# Patient Record
Sex: Female | Born: 1980 | Hispanic: Yes | Marital: Married | Smoking: Never smoker
Health system: Southern US, Community
[De-identification: ages and names within clinical notes are randomized; demographics above are authoritative.]

## PROBLEM LIST (undated history)

## (undated) DIAGNOSIS — K8050 Calculus of bile duct without cholangitis or cholecystitis without obstruction: Secondary | ICD-10-CM

## (undated) DIAGNOSIS — E039 Hypothyroidism, unspecified: Secondary | ICD-10-CM

## (undated) DIAGNOSIS — K8020 Calculus of gallbladder without cholecystitis without obstruction: Secondary | ICD-10-CM

## (undated) HISTORY — PX: CHOLECYSTECTOMY: SHX55

---

## 2007-06-20 DIAGNOSIS — K219 Gastro-esophageal reflux disease without esophagitis: Secondary | ICD-10-CM | POA: Insufficient documentation

## 2012-07-23 DIAGNOSIS — E039 Hypothyroidism, unspecified: Secondary | ICD-10-CM | POA: Insufficient documentation

## 2019-09-25 ENCOUNTER — Encounter: Payer: Self-pay | Admitting: Emergency Medicine

## 2019-09-25 ENCOUNTER — Other Ambulatory Visit: Payer: Self-pay

## 2019-09-25 ENCOUNTER — Emergency Department: Payer: HRSA Program

## 2019-09-25 ENCOUNTER — Emergency Department
Admission: EM | Admit: 2019-09-25 | Discharge: 2019-09-25 | Disposition: A | Discharge status: Home / Self Care | Payer: HRSA Program | Attending: Emergency Medicine | Admitting: Emergency Medicine

## 2019-09-25 DIAGNOSIS — R05 Cough: Secondary | ICD-10-CM | POA: Diagnosis present

## 2019-09-25 DIAGNOSIS — E039 Hypothyroidism, unspecified: Secondary | ICD-10-CM | POA: Diagnosis not present

## 2019-09-25 DIAGNOSIS — R079 Chest pain, unspecified: Secondary | ICD-10-CM | POA: Insufficient documentation

## 2019-09-25 DIAGNOSIS — R0600 Dyspnea, unspecified: Secondary | ICD-10-CM | POA: Diagnosis not present

## 2019-09-25 DIAGNOSIS — U071 COVID-19: Secondary | ICD-10-CM | POA: Diagnosis not present

## 2019-09-25 HISTORY — DX: Hypothyroidism, unspecified: E03.9

## 2019-09-25 LAB — CBC
HCT: 37.4 % (ref 36.0–46.0)
Hemoglobin: 12.4 g/dL (ref 12.0–15.0)
MCH: 28.3 pg (ref 26.0–34.0)
MCHC: 33.2 g/dL (ref 30.0–36.0)
MCV: 85.4 fL (ref 80.0–100.0)
Platelets: 378 10*3/uL (ref 150–400)
RBC: 4.38 MIL/uL (ref 3.87–5.11)
RDW: 13.1 % (ref 11.5–15.5)
WBC: 5.3 10*3/uL (ref 4.0–10.5)
nRBC: 0 % (ref 0.0–0.2)

## 2019-09-25 LAB — BASIC METABOLIC PANEL
Anion gap: 11 (ref 5–15)
BUN: 6 mg/dL (ref 6–20)
CO2: 26 mmol/L (ref 22–32)
Calcium: 9.2 mg/dL (ref 8.9–10.3)
Chloride: 101 mmol/L (ref 98–111)
Creatinine, Ser: 0.56 mg/dL (ref 0.44–1.00)
GFR calc Af Amer: >60 mL/min (ref >60)
GFR calc non Af Amer: >60 mL/min (ref >60)
Glucose, Bld: 107 mg/dL — ABNORMAL HIGH (ref 70–99)
Potassium: 3.7 mmol/L (ref 3.5–5.1)
Sodium: 138 mmol/L (ref 135–145)

## 2019-09-25 LAB — TROPONIN I (HIGH SENSITIVITY): Troponin I (High Sensitivity): <2 ng/L (ref <18)

## 2019-09-25 MED ORDER — KETOROLAC TROMETHAMINE 60 MG/2ML IM SOLN
60.0000 mg | Freq: Once | INTRAMUSCULAR | Status: AC
  Administered 2019-09-25: 60 mg via INTRAMUSCULAR
  Filled 2019-09-25: qty 2

## 2019-09-25 MED ORDER — ONDANSETRON HCL 4 MG PO TABS: 4.0000 mg | ORAL_TABLET | Freq: Every day | ORAL | 0 refills | Status: DC | PRN

## 2019-09-25 MED ORDER — ACETAMINOPHEN 325 MG PO TABS
650.0000 mg | ORAL_TABLET | Freq: Once | ORAL | Status: AC | PRN
  Administered 2019-09-25: 13:00:00 650 mg via ORAL
  Filled 2019-09-25: qty 2

## 2019-09-25 MED ORDER — HYDROCOD POLST-CPM POLST ER 10-8 MG/5ML PO SUER
5.0000 mL | Freq: Once | ORAL | Status: AC
  Administered 2019-09-25: 5 mL via ORAL
  Filled 2019-09-25: qty 5

## 2019-09-25 MED ORDER — GUAIFENESIN-CODEINE 100-10 MG/5ML PO SOLN: 5.0000 mL | Freq: Four times a day (QID) | ORAL | 0 refills | Status: DC | PRN

## 2019-09-25 MED ORDER — PREDNISONE 10 MG PO TABS
10.0000 mg | ORAL_TABLET | Freq: Every day | ORAL | 0 refills | Status: DC
Start: 2019-09-25 — End: 2024-03-28

## 2019-09-25 MED ORDER — ONDANSETRON 4 MG PO TBDP
4.0000 mg | ORAL_TABLET | Freq: Once | ORAL | Status: AC
  Administered 2019-09-25: 4 mg via ORAL
  Filled 2019-09-25: qty 1

## 2019-09-25 NOTE — ED Provider Notes (Signed)
[facility] Emergency Department Provider Note  Time seen: 1:52 PM  I have reviewed the triage vital signs and the nursing notes.   HISTORY  Chief Complaint Shortness of Breath and Chest Pain   HPI Mackenzie Douglas is a 39 y.o. female with a past medical history of hypothyroidism presents to the emergency department for cough fever shortness of breath.  According to the patient she was diagnosed with Covid approximately 9 days ago.  Continues to have fever, continues to have cough with shortness of breath so she came to the emergency department for evaluation.  Patient states she last took ibuprofen yesterday at 10 PM.  Currently febrile to 102.8 slightly tachycardic and tachypneic.  Reassuringly patient is satting 97% on room air.  Denies any chest pain.  Denies abdominal pain does state nausea but denies vomiting.  Past Medical History:  Diagnosis Date  . Hypothyroid     There are no problems to display for this patient.   History reviewed. No pertinent surgical history.  Prior to Admission medications   Medication Sig Start Date End Date Taking? Authorizing Provider  EUTHYROX 125 MCG tablet Take 125 mcg by mouth daily. 07/03/19   [provider]    No Known Allergies  History reviewed. No pertinent family history.  Social History Social History   Tobacco Use  . Smoking status: Never Smoker  . Smokeless tobacco: Never Used  Substance Use Topics  . Alcohol use: Not on file    Comment: occasionally  . Drug use: Never    Review of Systems Constitutional: Positive for fever. Cardiovascular: Negative for chest pain. Respiratory: Positive for shortness of breath.  Positive for cough. Gastrointestinal: Negative for abdominal pain, vomiting positive for nausea. Genitourinary: Negative for urinary compaints Musculoskeletal: Negative for musculoskeletal complaints Neurological: Negative for headache All other ROS  negative  ____________________________________________   PHYSICAL EXAM:  VITAL SIGNS: ED Triage Vitals  Enc Vitals Group     BP 09/25/19 1209 108/87     Pulse Rate 09/25/19 1209 (!) 121     Resp 09/25/19 1209 (!) 22     Temp 09/25/19 1209 (!) 102.8 F (39.3 C)     Temp Source 09/25/19 1209 Oral     SpO2 09/25/19 1209 97 %     Weight 09/25/19 1210 134 lb (60.8 kg)     Height 09/25/19 1210 5\' 2"  (1.575 m)     Head Circumference --      Peak Flow --      Pain Score 09/25/19 1209 8     Pain Loc --      Pain Edu? --      Excl. in [place]? --    Constitutional: Alert and oriented. Well appearing and in no distress. Eyes: Normal exam ENT      Head: Normocephalic and atraumatic.      Mouth/Throat: Mucous membranes are moist. Cardiovascular: Tachycardic around 120 bpm.  No obvious murmur. Respiratory: Normal respiratory effort without tachypnea nor retractions. Breath sounds are clear.  Frequent cough but no obvious wheeze rales or rhonchi. Gastrointestinal: Soft and nontender. No distention.  Musculoskeletal: Nontender with normal range of motion in all extremities.  Neurologic:  Normal speech and language. No gross focal neurologic deficits  Skin:  Skin is warm, dry and intact.  Psychiatric: Mood and affect are normal.  ____________________________________________    EKG  EKG viewed and interpreted by myself shows sinus tachycardia 122 bpm with a narrow QRS, normal axis, normal intervals,  no concerning ST changes.  ____________________________________________    RADIOLOGY  Right sided pneumonia  ____________________________________________   INITIAL IMPRESSION / ASSESSMENT AND PLAN / ED COURSE  Pertinent labs & imaging results that were available during my care of the patient were reviewed by me and considered in my medical decision making (see chart for details).   Patient presents to the emergency department with continued cough fever nausea and shortness of breath.   Patient diagnosed with Covid several days ago, has been symptomatic for the past 9 days.  Overall the patient appears well satting 97% on room air.  Patient has not taken any antipyretics since yesterday.  We will dose Toradol, Zofran, Tussionex and continue to closely monitor in the emergency department.  As the patient is satting 97% I believe it is reasonable discharge patient home with PCP follow-up and prescriptions for prednisone Zofran and cough medication.  I discussed return precautions.  Patient agreeable to plan of care.  Mackenzie Douglas was evaluated in Emergency Department on 09/25/2019 for the symptoms described in the history of present illness. She was evaluated in the context of the global COVID-19 pandemic, which necessitated consideration that the patient might be at risk for infection with the SARS-CoV-2 virus that causes COVID-19. Institutional protocols and algorithms that pertain to the evaluation of patients at risk for COVID-19 are in a state of rapid change based on information released by regulatory bodies including the CDC and federal and state organizations. These policies and algorithms were followed during the patient's care in the ED.  ____________________________________________   FINAL CLINICAL IMPRESSION(S) / ED DIAGNOSES  Dyspnea COVID-19   Minna Antis, MD 09/25/19 1404

## 2019-09-25 NOTE — ED Triage Notes (Signed)
Pt via pov from dr office with sob and chest pain and headache. Pt states she was diagnosed with covid on Tuesday; symptoms much worse today. Pt states she was told to take tylenol/ibuprofen/vit c/zinc. She is concerned that she will be unable to breathe.  Pt alert & oriented, coughing during triage.

## 2020-04-06 DIAGNOSIS — K8051 Calculus of bile duct without cholangitis or cholecystitis with obstruction: Secondary | ICD-10-CM | POA: Diagnosis present

## 2020-04-06 DIAGNOSIS — K8020 Calculus of gallbladder without cholecystitis without obstruction: Secondary | ICD-10-CM | POA: Insufficient documentation

## 2021-05-11 IMAGING — CR DG CHEST 2V
2 series · 2 of 2 positions shown · non-contrast
Comparison: None.

CLINICAL DATA: Chest pain

EXAM:
CHEST - 2 VIEW

[chest pa]
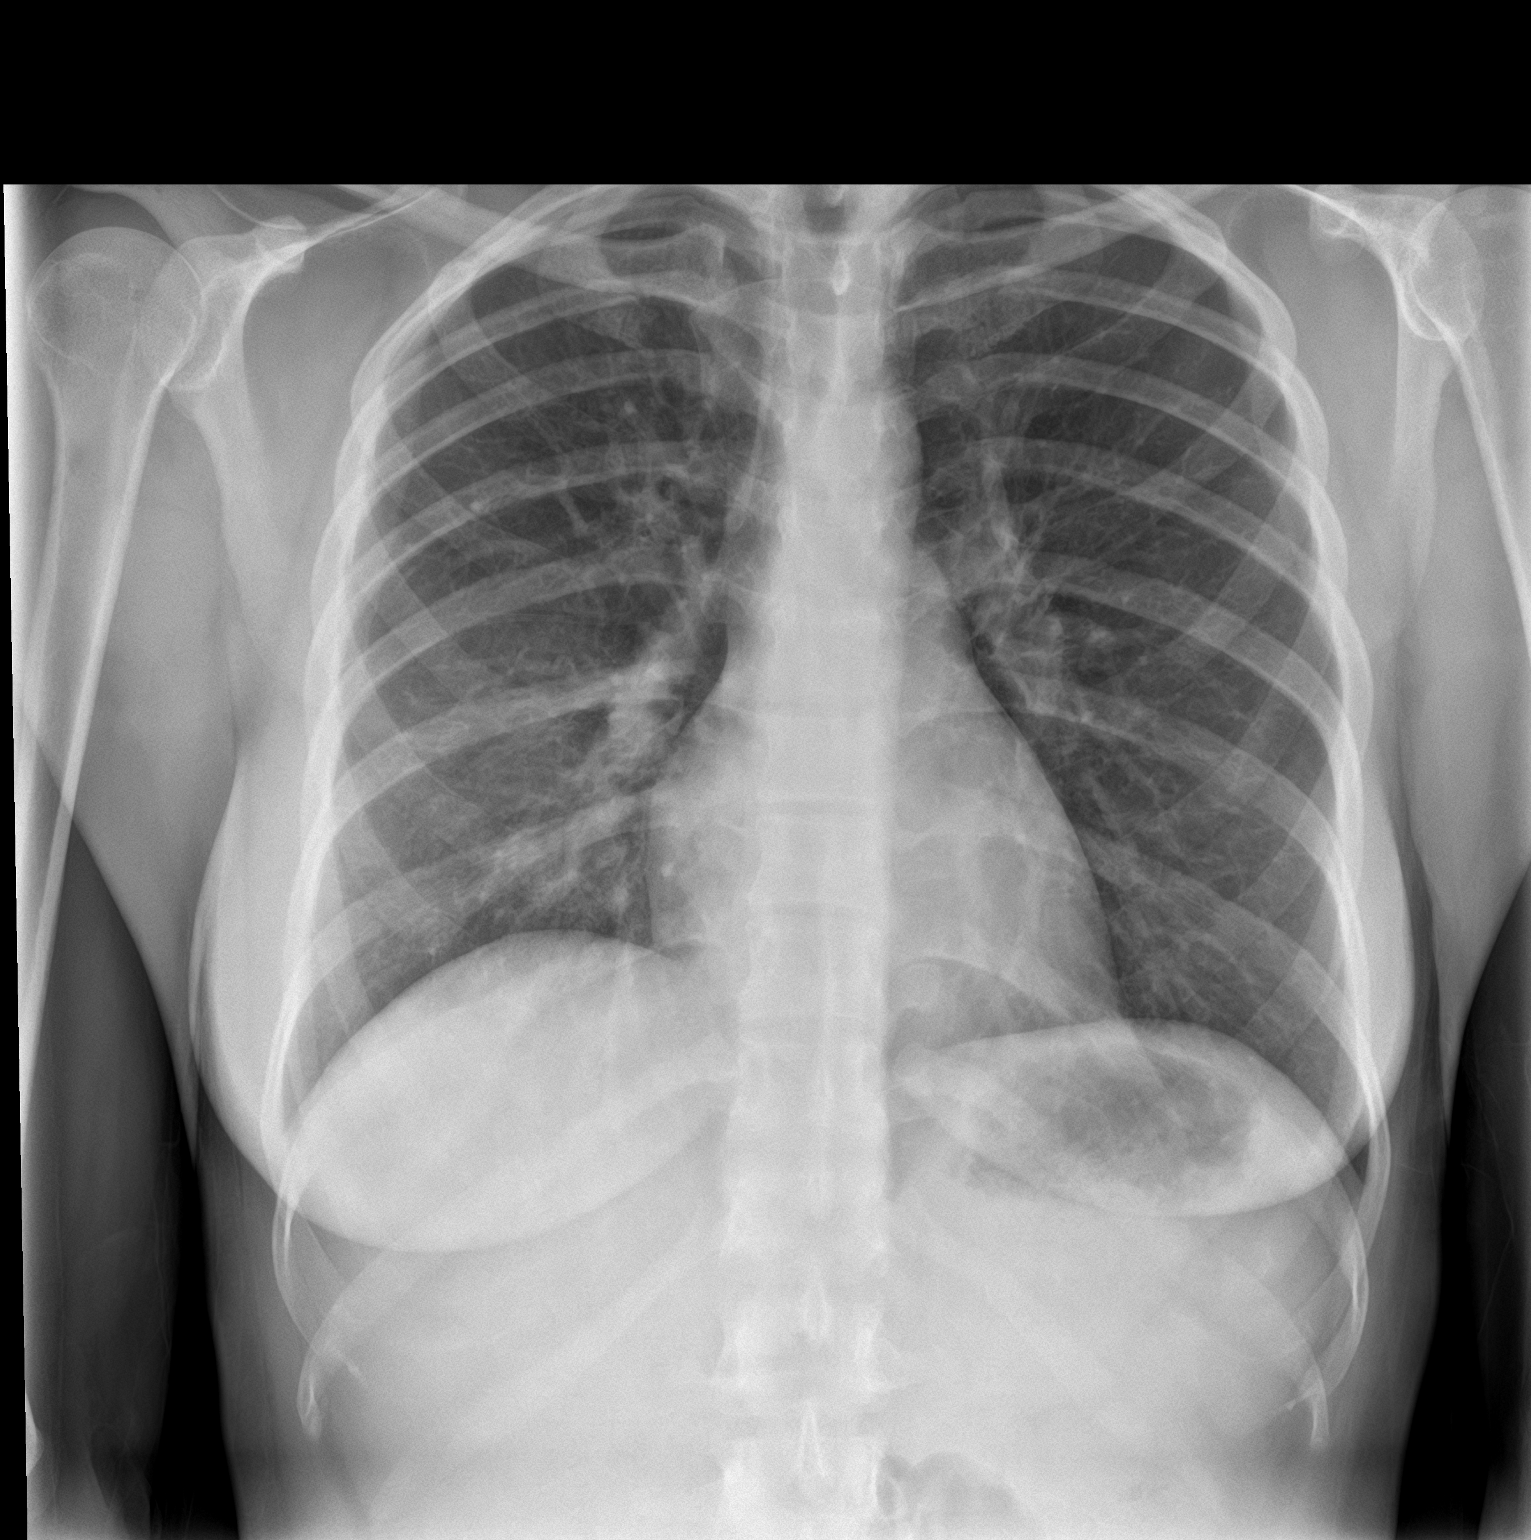

[chest lat]
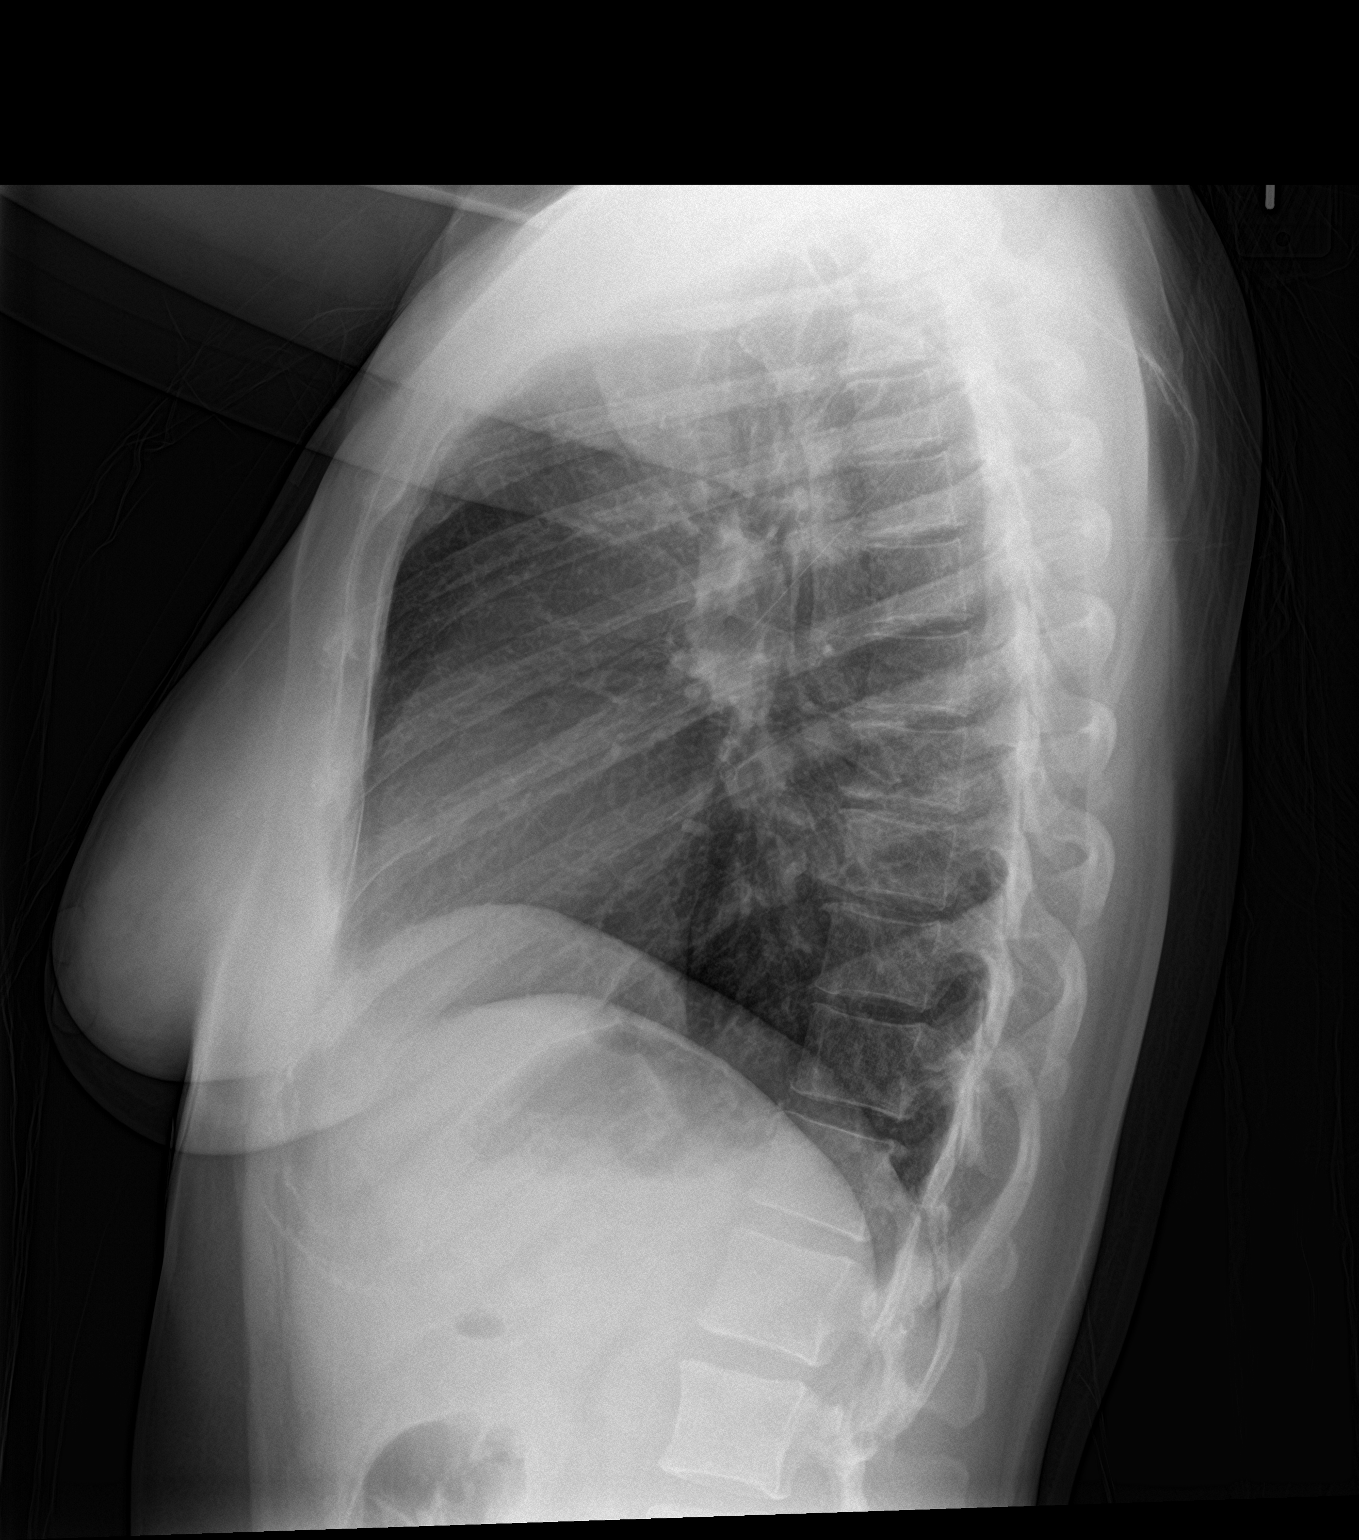

[2 of 2 positions shown; findings below may reference images not displayed]

FINDINGS: There is mild hazy right lower lobe airspace disease. There is no
focal consolidation. There is no pleural effusion or pneumothorax.
The heart and mediastinal contours are unremarkable.

There is no acute osseous abnormality.
IMPRESSION: Mild hazy right lower lobe airspace disease concerning for pneumonia
versus atelectasis.

## 2022-05-31 NOTE — Discharge Summary (Addendum)
 SPANISH TRANSLATION OF AVS THIS IS A DRAFT OF THE SPANISH TRANSLATION OF THE AVS. DO NOT PRINT UNTIL IT HAS BEEN UPDATED BY A SPANISH INTERPRETER.     RESUMEN DE LA VISITA [facility]   Nm. de expediente: [phone]    Clculo biliar  05/30/2022-06/01/2022  5 BT [facility]  [phone]  Los siguientes pasos  Hacer Recoja estos medicamentos en [facility] acetaminophen  docusate sodium  HEALTHYLAX LIDOCAINE PAIN RELIEF oxyCODONE  Leer Lea los documentos adjuntos (los adjuntos en espaol se incluyen en el resumen de la visita en ingls) Colecistectoma:  Informacin postoperatoria (ESPAOL)  Asistir 18 de enero CITA POSTOPERATORIA 2:30 p. m. Llegue a las 2:00 p. m.  [facility]  [address]   [place] [address]  [phone]     ___________________________________________ Instrucciones    Sus medicamentos han cambiado  EMPIECE a usar: acetaminophen  (TYLENOL )  docusate sodium (COLACE)  HEALTHYLAX(polyethylene glycol) LIDOCAINE PAIN RELIEF(lidocaine) oxyCODONE (ROXICODONE)   SIGA usando sus otros medicamentos. Revise la lista actualizada de medicamentos a continuacin.  ___________________________________________  [employer] signos vitales ms recientes Presin arterial 98/53 Temperatura (Oral) 96.1 F Pulso 78 Respiracin 18 Saturacin de oxgeno 96%   Instrucciones sobre la alimentacin Alimentacin del alta (especificar)  Alimentacin del alta: Normal Interno    Otras instrucciones  Llame al mdico si tiene nuseas o vmitos persistentes.   Llame al mdico si tiene enrojecimiento, dolor a la palpacin, o seales de infeccin (dolor, hinchazn, enrojecimiento, mal olor o secrecin verde o amarilla alrededor de la incisin).   Llame al mdico si tiene dolor intenso incontrolable.  Llame al mdico si tiene: Temperatura de ms de 38.5 grados Celsius (ms de 101.3 grados  Fahrenheit).   Instrucciones del alta  Instrucciones   ALIMENTACIN: Puede reanudar su alimentacin normal. Puede que experimente una sensacin de plenitud gstrica o que tenga heces lquidas durante varios das despus de la ciruga, especialmente con alimentos ricos en grasas.   ACTIVIDAD: Puede ducharse a partir de maana (12 de enero), pero evite los baos, baeras de hidromasaje, piscinas, sumergirse o sentarse en el agua durante 2 semanas.  Deje que el jabn y el agua corran sobre la incisin.  No se frote la incisin.       No levante ms de 10 libras o realice actividades vigorosas durante 2 semanas (hasta el 24 de enero de 2024) y luego no ms de 20 libras de 2 a 6 semanas. Si siente tirones en la incisin, disminuya la [place].  Siga estas restricciones hasta el 21 de febrero de 2024.   INCISIN: Revise las incisiones por lo the interpublic group of companies. Una pequea cantidad de secrecin es normal, especialmente durante los primeros das despus de la operacin. Si nota cualquier enrojecimiento alrededor de las incisiones que se extiende por la piel o cualquier secrecin espesa y amarilla, debe llamar a la clnica. El pegamento quirrgico sobre las incisiones se disolver con el tiempo por si solo. No se aplique ungentos o cremas sobre el pegamento. No se quite el pegamento ni tire de l. Evite llevar ropa ajustada.   CUIDADO DEL TUBO DE DRENAJE:  Vace los tubos de drenaje 1 a 2 veces al da, o cuando la pera de goma se haya llenado, conforme se lo haya indicado la enfermera. Registre la cantidad y writer del drenado (color, consistencia, etc.) diariamente, y traiga el registro a su cita de seguimiento. Si el lquido deja de gaffer  del tubo de drenaje de repente, o aumenta demasiado la cantidad producida, o cambia de colores, llame a la clnica.     MEDICAMENTO PARA EL DOLOR: No conduzca autos, ni beba alcohol mientras est tomando el medicamento recetado para chief technology officer. Tome el  medicamento que se le recet para el dolor solamente cuando lo necesite. Puede disminuir la cantidad de medicamento para chief technology officer segn lo tolere y tomar Tylenol  o Motrin siguiendo las instrucciones en el envase.  El medicamento para el dolor puede causar estreimiento. Para evitarlo, deber tomar un ablandador fecal de venta libre, tal como docusate 100 mg 1 a 2 veces al da.   A partir del [address] de junio de 2017, el Gobernador firm [employer] [place] creada para abordar la crisis de opioides que enfrenta [place].  Se llama la ley STOP, abreviatura para Strengthen Opioid Misuse Prevention, 'fortalecer la prevencin de uso indebido de opioides'.       Como parte de esta ley, inicialmente estamos limitados a camera operator un suministro para no ms de pulte homes de automatic data opioide para chief technology officer para nuestros pacientes que se han sometido a una operacin.  (Esta ley tambin limita las recetas para pacientes que no se han sometido a physiological scientist operacin a un suministro para wps resources.     Si necesita medicamentos adicionales para el control de dolor, el mejor nmero al que puede llamar es [phone] antes de que se acaben sus medicamentos actuales.  Durante la noche o fin de [place], llame al [phone] y pregunte por el mdico residente de ciruga de guardia, surgery resident on call. Es posible que el proveedor que est de guardia NO podr renovar su receta.     La Ley STOP tambin supervisa la cantidad de sustancias controladas que cada paciente ha recibido de todos y cada uno de los proveedores.  Las recetas para cada paciente las supervisar [place] Controlled Substances Reporting System 'Sistema de notificacin de sustancias controladas de [place]' de acuerdo con la ley.  Gracias por colaborar con nosotros para que podamos brindar un buen control del dolor de la manera ms segura posible mientras se recupera.   Por favor tenga en cuenta que no se puede llamar a la farmacia para renovar el medicamento  para chief technology officer. Si cree que necesita ms medicamento para el dolor, tendrn que verlo en la clnica o en la sala de emergencias para una evaluacin ms a fondo para asegurarse de que no est sufriendo una complicacin. Est pendiente de cuntas pastillas quedan en el frasco y si tiene pocas y cree que necesitar ms para human resources officer en forma adecuada, llame a la clnica lo antes posible para programar una cita, ya que nuestras clnicas estn muy ocupadas y no podemos garantizarle citas en la misma semana.   El nmero de telfono de la oficina de Traumatologa es [phone]. Para emergencias durante la noche o fines de semanas, llame al [phone] y pregunte por el mdico residente de ciruga de guardia, surgery resident on call.   CUNDO DEBE LLAMAR A LA OFICINA DEL CIRUJANO:  1. Secrecin espesa y [employer] incisiones, enrojecimiento en la zona de las incisiones, sangrado, o separacin de las heridas.  2. Temperatura de ms de 101.5 grados. 3. Nuseas o vmitos incontrolables.  4. Dolor que no se controla con el medicamento para chief technology officer. 5. Ictericia (color amarillento en los ojos o la piel).   Puede alternar Tylenol  y Motrin para ayudar a dejar  de tomar medicamentos opioides.   -  Es muy importante para nosotros que chief technology officer del paciente despus de la operacin est controlado.  Durante el mes despus de que tenga la operacin, puede que reciba una llamada de nosotros preguntndole sobre el dolor postoperatorio y sobre cmo control ese dolor.  La encuesta tomar aproximadamente de 5 a 10 minutos y nos ayudar a proporcionarles una mejor atencin a nuestros pacientes.  Gracias de antemano por su participacin y por favor responda cualquier llamada que provenga de la localidad de [place] o [place].   Seguimiento   Informacin y nmeros de telfono importantes:    Production Manager of Surgery, Division of General and Acute Care Surgery (Departamento de [place],  Divisin de ciruga general y insurance claims handler)  [address]  [place]. [facility] [address]  [place], [place]  [address]  Durante el horario normal de oficina: De lunes a viernes, de 8:00 a. m. a 4:30 p. m. llame a:   - Informacin de contacto de las enfermeras:  [phone]: [place]  [phone]: Ike Narda Burnet)  [phone]: Andres (si necesita cambiar la fecha de la operacin)  Fax: [phone]   Nmero de telfono de la clnica: Para programar, cambiar o cancelar una cita:  [phone]   Everitt noche y en fines de semana, llame a la operadora del hospital y pregunte por el mdico residente de reubin everitt countryman, Surgery Resident on Call al: [phone]   Si tiene una solicitud de FMLA (Family & Medical Leave Act, Toi everitt knack familiar y mdica) u otro papeleo que deba completarse relacionado con el trabajo, la escuela, etc., que no se haya completado durante su hospitalizacin, envelo por fax al nmero antes mencionado [phone] en lugar de llevarlo a su cita en la clnica y esto ayudar a disminuir cualquier retraso en completar el papeleo.   Cita el 18 de enero de 2024 a las 2:30 p. m. para hablar del periodo postoperatorio y oceanographer el tubo de drenaje. Si tiene inquietudes, llame para programar una cita.   Tenga en cuenta que, si llega ms de 20 minutos tarde, tendr que reprogramar la cita.   *Para obtener apoyo e informacin sobre el abuso de sustancias, puede llamar a la lnea nacional de ayuda de SAMHSA (Substance Abuse and Mental Health Services Administration / Administracin de servicios para el abuso de sustancias y la salud mental). Es un servicio gratis, confidencial, las 24 horas del da, [phone] de la [place], 365 das del ao para remisiones para tratamiento y servicio de informacin (en ingls y health and safety inspector) para personas y sus familias que afrontan trastornos por el uso de sustancias o trastornos de salud mental. 1-800-662-HELP [phone]).   Interaccin  farmacolgica Recibi un medicamento durante su operacin (Sugammadex) que pudiese disminuir la eficacia de las pastillas anticonceptivas u otro anticonceptivo hormonal hasta por 7 das. Sugammadex ([place]) es un medicamento que ayuda a psychiatrist de los anestsicos que los pacientes reciben durante la operacin.    Si est usando algn anticonceptivo hormonal (incluyendo pastillas anticonceptivas, inyecciones, implantes, dispositivos intrauterinos, parches, o anillos intravaginales), utilice otro mtodo anticonceptivo complementario hasta por 7 das para prevenir un embarazo imprevisto. Los mtodos anticonceptivos complementarios incluyen condones/condones femeninos, diafragma, capuchn cervical, esponja o abstinencia. Siga tomando sus pastillas anticonceptivas o usando su anticonceptivo hormonal durante este periodo de tiempo.   Seguimiento   18 de enero CITA POSTOPERATORIA jueves, 18 de enero de 2024 2:30 p. m. (Llegue a las 2:00  p. m.) [facility] [address]  [place] [address] [phone]   Motivo de la hospitalizacin Su diagnstico primario fue: Clculo biliar   Mdicos que lo atendieron durante la hospitalizacin  Proveedor Servicio Funcin Especialidad   Raff, Tinnie Verla Dawn, MD -- Mdico a cargo Ciruga general   Es alrgico a lo siguiente  Se desconoce que tiene alergias    Lista de medicamentos   Por la maana Por la tarde Por la noche A la hora de irse a dormir Cuando sea necesario    EMPIECE A USAR   acetaminophen  325 MG tablet Comnmente conocido como: TYLENOL  Tome 2 tabletas (650 mg total) por va oral cada seis (6) horas durante 7 das. La ltima vez que se administr: Pregunte a su enfermera o mdico.         EMPIECE A USAR   docusate sodium 100 MG capsule Comnmente conocido como: COLACE Tome 1 cpsula (100 mg total) por va oral a diario durante 7 das. Use mientras toma medicamentos  opioides para evitar el estreimiento.         EMPIECE A USAR HEALTHYLAX 17 gram packet Mezcle el contenido de 1 paquete (17 g) en 4 a 8 onzas de lquido y beba a diario [address] Use mientras toma medicamentos opioides para evitar el estreimiento. Nombre genrico: polyethylene glycol         levothyroxine 125 MCG tablet Comnmente conocido como: SYNTHROID Tome 1 tableta (125 mcg total) por va oral a diario. La ltima vez que se administr: 125 mcg el 11 de enero de 2024  9:03 a. m.         EMPIECE A USAR   LIDOCAINE PAIN RELIEF 4 % patch Coloque 1 parche sobre la piel a diario durante [phone] Djelo puesto solo durante 12 horas management consultant. La ltima vez que se administr: Pregunte a su enfermera o mdico. Nombre genrico: lidocaine         EMPIECE A USAR   oxyCODONE 5 MG immediate release tablet Comnmente conocido como: ROXICODONE Tome 1 tableta (5 mg total) por va oral cada cuatro (4) horas segn sea necesario hasta 8 dosis. La ltima vez que se administr: 10 mg el 11 de enero de 2024  8:53 p. m.         VITAL-D RX ORAL Tome por va oral.         Dnde obtener los medicamentos   Recoja estos medicamentos en [facility] acetaminophen  . docusate sodium . HEALTHYLAX . LIDOCAINE PAIN RELIEF . oxyCODONE Direccin: [address], [place] [address]  Horario: desde las 7 a. m. hasta las 8 p. m. de lunes a viernes, desde las 8:30 a. m. hasta las 4 p. m. los sbados y domingos (solo para pacientes que se dan de alta)  Telfono: [phone]    Tylenol /acetaminophen  Administraciones (durante las ltimas 24 horas)      Fecha/hora Accin Medicamento Dosis Velocidad   06/01/22 0349 [place] acetaminophen  (OFIRMEV ) 10 mg/mL injection 1,000 mg 1,000 mg 400 ml/hora   05/31/22 1518 Administrado   acetaminophen  (TYLENOL ) tablet 650 mg 650 mg     05/31/22 9096 Administrado   acetaminophen  (TYLENOL ) tablet 650 mg 650 mg     Aprenda sobre  cmo guardar y animator de pastillas y parches de opioides de forma segura - la informacin en espaol se incluye en el resumen de la visita en ingls.   MyChart  Enve mensajes al [employer], revise los [address] de pruebas mdicas, renueve las [place], haga citas y mucho ms!   Vaya a [email or web address] y haga clic en Use Your Activation Code. Escriba su cdigo de activacin de My [facility] Chart exactamente como aparece a continuacin junto con su fecha de nacimiento para completar el proceso de activacin.     Cdigo de activacin de My [facility] Chart: No se gener un cdigo de activacin. Estado actual de MyChart: Activo   Si necesita ayuda con My [facility] Chart, llame al [facility] al [phone].      Care Everywhere CEID [id number] : Este nmero de identificacin se puede usar si otra instalacin mdica que utiliza el programa Epic necesita solicitar el expediente mdico de [place].   Informacin de recursos ante una crisis: Lnea directa nacional de prevencin del suicidio:     79   Lnea de atencin ante una crisis en [place]:     [phone]

## 2022-06-02 NOTE — Discharge Summary (Signed)
 ------------------------------------------------------------------------------- Attestation signed by Veria Prentice NOVAK, MD at 06/03/22 [phone] Attending Attestation I saw and evaluated the patient, participating in the key portions of the service on the day of discharge.  I reviewed the resident's note and agree with the discharge plans and disposition. I personally spent less than 30 minutes in discharge planning services.  Prentice WENDI Veria, MD MSc Assistant Professor Division of Trauma/Critical Care and Acute Care Surgery Pager: [phone]  -------------------------------------------------------------------------------   Discharge Summary  Admit date: 05/30/2022  Discharge date and time: 06/02/2022  Discharge to:  Home  Discharge Service: [facility]uma [facility])  Discharge Attending Physician: Prentice Veria, MD  Discharge  Diagnoses:  Severe chronic cholecystitis   Secondary Diagnosis: Principal Problem:   Gallstones (POA: Unknown) Resolved Problems:   * No resolved hospital problems. *   OR Procedures:   LAPAROSCOPY, SURGICAL; CHOLECYSTECTOMY Date 05/30/2022 -------------------   Ancillary Procedures: no procedures  Discharge Day Services: The patient was seen and examined by the Trauma Surgery team on the day of discharge.  Vital signs and laboratory values were reviewed.  Surgical incisions were examined.  Discharge plan was discussed, instructions were given and all questions answered. Patient will discharge with drain in place. Instructed on recording volume and characteristics of drain output. Will RTC in 1 week for possible drain removal.    Subjective  No acute events overnight. Tolerated food, would like to discharge.  Objective  Patient Vitals for the past 8 hrs:  BP Temp Temp src Pulse Resp SpO2  06/02/22 1247 116/60 35.9 C (96.6 F) Oral 75 18 99 %   I/O this shift: In: 480 [P.O.:480] Out: 300 [Urine:300]  Hospital Course:  Mackenzie Douglas 42  y.o. PMH hypothyroidism, HA, constipation who presented for elective cholecystectomy 05/30/2022.  The patient was taken to the OR on 05/21/2022 for laparoscopic cholecystectomy.  Operative findings were Severe chronic cholecystitis. A 19 Fr blake drain was left in the GB fossa. The procedure itself was uneventful and without complications. She tolerated the procedure well, was extubated in the OR, and received routine post-operative care before being transferred to the floor. Post op course c/b nausea and vomiting. Her diet was advanced as tolerated, and by discharge she was tolerating a regular diet. She was voiding adequately, ambulating independently, and her pain was controlled wth PO pain medications. She was examined by the Trauma Surgery team on the day of discharge and was deemed suitable for discharge home with drain in place. Drain teaching completed prior to discharge.  She will be discharged on POD #2 in good condition.  I spent greater than 30 minutes completing this discharge.   Condition at Discharge: Improved Discharge Medications:    Medication List    START taking these medications   . acetaminophen  325 MG tablet; Commonly known as: TYLENOL ; Tome 2 pldoras  por va oral cada 6 horas. (Dosis = 650 mg) Use durante 7 das.; (Take 2  tablets (650 mg total) by mouth every six (6) hours for 7 days.) . docusate sodium 100 MG capsule; Commonly known as: COLACE; Take 1  capsule (100 mg total) by mouth daily for 7 days. Use while taking opioids  to avoid constipation. SABRA HEALTHYLAX 17 gram packet; Generic drug: polyethylene glycol; Mix 1  packet (17 g) in 4 to 8 ounces of liquid and drink by mouth daily for 7  days. Use while taking opioids to avoid constipation. SABRA LIDOCAINE PAIN RELIEF 4 % patch; Generic drug: lidocaine; Place 1 patch  on  the skin daily for 5 days. Wear for 12 hours only each day, . oxyCODONE 5 MG immediate release tablet; Commonly known as: ROXICODONE;  Take 1 tablet  (5 mg total) by mouth every four (4) hours as needed for up  to 8 doses.   CONTINUE taking these medications   . levothyroxine 125 MCG tablet; Commonly known as: SYNTHROID . VITAL-D RX ORAL    Pending Test Results: Surgical Pathology   Discharge Instructions:  Diet Instructions     Discharge diet (specify)     Discharge Nutrition Therapy: Regular      Other Instructions: Other Instructions     Call MD for:  persistent nausea or vomiting     Call MD for:  redness, tenderness, or signs of infection (pain, swelling, redness, odor or green/yellow discharge around incision site)     Call MD for:  severe uncontrolled pain     Call MD for: Temperature > 38.5 Celsius ( > 101.3 Fahrenheit)     Discharge instructions     Instructions  DIET: You may eat your regular diet. You may experience bloating or have loose, watery stool for several days after surgery, especially with foods higher in fat.  ACTIVITY: You may shower starting tomorrow 1/12, but please avoid baths, hot tubs, pools, soaking or sitting in water for 2 weeks.  Please allow the soap and water to run over your incision.  Do not scrub your incision.     No lifting greater than 10 pounds or strenuous activity for 2 weeks (until 06/13/2022) then nothing over 20 pounds from 2-6 weeks. If feeling pulling at incision back off.  Continue restrictions until 07/11/2022.  INCISION: Look at your incisions at least twice a day. A small amount of drainage is normal, especially in the first couple of days after surgery. If you notice any redness around your incisions that spreads along your skin, or any thick, yellow drainage, you should call the clinic. The surgical glue that was placed over your incisions will dissolve naturally over time. Do not put any ointments or cream over the glue, and do not pull the glue off or pick at it. Avoid wearing tight clothing.  DRAIN CARE:  Please empty your drains 1-2 times daily, or when the bulb has  filled, as instructed by nursing. Record the amount and characteristics of the drain (color, consistency, etc.) daily, and bring a record of this to your follow-up appointment. If the output from your drain suddenly stops, has a large increase in the amount of output, or changes colors, please call the clinic.   PAIN MEDICATION: Do not drive or drink alcohol while taking prescription pain medication. Take your prescribed pain medication only as needed. You may decrease the amount of pain medication as tolerated, and take Tylenol  or a Motrin product as directed on the label.  Pain medication can cause constipation. To avoid this, you should take an over-the-counter stool softener, such as docusate 100mg  1-2 times daily.   Effective November 17, 2015, the [place] signed a [place]  law that works to address the opioid crisis our state is facing.  It is called the STOP Law, for Strengthen Opioid Misuse Prevention.     As part of this law, we are limited to prescribing no more than a seven-day supply of opioid pain medicine for our surgery patients initally.  (This law also limits prescriptions for patients who have not had surgery to a five-day supply.)   If you are in need  of additional pain control, the best number to call is [phone] before your current medications run out.  For after hours and weekends, please call [phone] and ask to speak with the surgery resident on call. The on-call provider may NOT be able to renew your prescription.   The STOP Act also monitors the amount of controlled substances that each patient has received from any and all providers.  Prescriptions for each patient will be monitored by the [place] Controlled Substances Reporting System in accordance with the law.  Thank you for working with us  so we can provide good pain control in the safest way possible while you recover.   Please note that prescription pain medication refills will not be called into your pharmacy. If  you feel that you are needing more pain medication, you will need to be seen in clinic or the Emergency Department for further evaluation to ensure you are not experiencing a complication. Please take note of how many pills you have left in your bottle, and if you are starting to run low and feel that you will need more for adequate pain control, to call the clinic as soon as possible to schedule an appointment, as our clinics are very busy, and we cannot guarantee same week appointments.  The Trauma Surgery office number is [phone]. For emergencies at night or on the week-end, please call [phone]) and ask for the surgery resident on call.  WHEN TO CALL THE SURGEON'S OFFICE: 1. Thick, yellow drainage from your incisions, redness at incisions, bleeding, or separations of wounds. 2. Temperature greater than 101.5 3. Uncontrolled nausea or vomiting. 4. Pain that is not controlled by your pain medication  5. Jaundice (yellow eyes or skin)  May alternate tylenol  and motrin to help wean off of opioid medications.  Standard Patient Notification Script We care deeply about how our patients' pain is managed after surgery.  During the month after you have surgery, you may receive a call from us  asking about your postsurgical pain and about how you managed that pain.  The survey will take approximately 5 to 10 minutes and will help us  to provide better care to our patients.  Thank you in advance for your participation and please answer any calls coming from the [facility] or [place] area!  Follow Up  Important Information and Numbers:   [facility] Department of Surgery, Division of General and Acute Care Surgery [address] [facility] - First Floor [place], [place]  [address] During regular business hours: (Monday-Friday, 8am-4:30 pm) call,   -Contact Information for Nurses: [phone]: [place] [phone]: [place] (Dr. Francella) [phone]: Office (if you need to change your OR  Date) Fax: [phone]  Clinic Number: To schedule, reschedule or cancel an appointment:  [phone]   During Nights and Weekends will need to reach Resident on Call: [phone]  If you have any FMLA or other paperwork that needs filled out related to work, school, etc., that hasn't been filled out during your hospitalization, please fax to the number listed above [phone]) instead of bringing to your clinic appointment and this will help decrease any delays in paperwork completion.  06/07/2022 @ 2:30PM to discuss post op course and remove drain. If having concerns please call to schedule an appointment.   Please note, if you arrive more than 20 minutes late, you will need to reschedule your appointment  *For support and information on substance abuse you may call the [facility]. It is free, confidential  24/7, 365 day-a-year treatment referral and information service (in English and Spanish) for individuals and families facing mental and/or substance use disorders. 1-800-662-HELP (4357).      Labs and Other Follow-ups after Discharge: Follow Up instructions and Outpatient Referrals    Call MD for:  persistent nausea or vomiting     Call MD for:  redness, tenderness, or signs of infection (pain, swelling,  redness, odor or green/yellow discharge around incision site)     Call MD for:  severe uncontrolled pain     Call MD for: Temperature > 38.5 Celsius ( > 101.3 Fahrenheit)     Discharge instructions       Future Appointments: Appointments which have been scheduled for you    Jun 07, 2022  2:30 PM (Arrive by 2:00 PM) POST OP with GEN AND ACUTE ATTENDING [facility] GENERAL AND ACUTE CARE SURGERY [place] [facility]) [address] [place] [id number] [phone]

## 2022-06-02 NOTE — Discharge Summary (Addendum)
 [facility]  [facility] [address] [place] [address] Loc: [phone]  RESUMEN DE LA VISITA Mackenzie Douglas   Nm. de expediente: [phone]    Piedras en la vescula  05/30/2022-06/02/2022  5 BT [facility]  [phone]  Los siguientes pasos  --------- Hacer---------- ? Recoja  6 medicamentos en [facility] CENTRAL OUT-PT PHARMACY  --------- [place]---------- ? 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 m. (Llegar a las 2:00 p. m.) [facility] GENERAL AND ACUTE CARE SURGERY [place] [address]  [place] [address] [phone]      Instrucciones    Sus medicamentos han cambiado   EMPIECE a tomar: acetaminophen  (TYLENOL )  docusate sodium (COLACE)  HEALTHYLAX (polyethylene glycol)  polyethylene glycol (MIRALAX)  LIDOCAINE PAIN RELIEF (lidocaine)  oxyCODONE (ROXICODONE)   Revise la lista de medicamentos actualizada a continuacin  __________________________________________________________________ [employer] ltimos signos vitales  Presin arterial 116/60  IMC 24,14  Peso 132 libras  Altura 5' 2  Temperatura (oral) 96.6 F  Pulso 75  Respiracin 18  Saturacin de oxgeno 99%  ASC 1.62 m        Instrucciones sobre la alimentacin  Dieta despus del alta Alimentacin normal     Otras instrucciones  Llamar al mdico si tiene temperatura de ms de 38.5 grados Celsius Llamar al mdico si tiene dolor intenso incontrolable Llamar al mdico si tiene enrojecimiento, dolor ligero o seales de infeccin (dolor, hinchazn, enrojecimiento, olor o secrecin verde o amarilla alrededor de la incisin)     DIETA: Puede seguir su dieta habitual. Es posible que experimente hinchazn  heces blandas y acuosas durante varios das despus de la ciruga, especialmente con  alimentos ricos en grasas.  ACTIVIDAD: Puede ducharse, pero evite baos, jacuzzis, piscinas, remojarse  sentarse en agua durante 2 semanas. Deje que el agua y el jabn corran sobre su incisin. No frote la incisin.   No levante ms de 10 libras  realice actividades vigorosas durante 2 semanas y luego no ms de 20 libras de 2 a 6 semanas. Si siente que el aumento de peso tira de la incisin levante constellation brands.  Siga con estas restricciones hasta el 21 de febrero de 2024.  INCISIN: Revise las incisiones por lo the interpublic group of companies. Una pequea cantidad de secrecin es normal, especialmente durante los primeros das despus de la operacin. Si nota algn enrojecimiento alrededor de las incisiones que se extiende a lo largo de la piel  secrecin espesa y amarilla, debe llamar a la clnica. El pegamento quirrgico en las incisiones se disolver con el tiempo de [place] natural. No se aplique pomadas  cremas sobre el pegamento. No se quite el pegamento ni tire de l. Evite llevar ropa ajustada.   CUIDADO DEL DRENAJE: Vace los drenajes 1  2 veces al da  cuando el bulbo se haya llenado, segn las instrucciones de la enfermera. Registre diariamente la cantidad y writer del drenaje (color, consistencia, etc.) y lleve un registro de ello a su cita de seguimiento. Si el drenaje de su drenaje se detiene repentinamente, tiene un gran aumento en la cantidad de drenaje  cambia de color, especialmente si cambia a un color marrn  verde, llame a la clnica lo antes posible.  MEDICAMENTO PARA EL DOLOR: No conduzca autos, ni beba alcohol mientras est tomando el medicamento recetado para chief technology officer. Tome el medicamento que se le  recet para el dolor solamente cuando lo necesite. Puede disminuir la cantidad de medicamento para chief technology officer segn lo tolere y tomar Tylenol   Motrin siguiendo las instrucciones de la etiqueta.  El medicamento para el dolor puede causar estreimiento. Para evitarlo, deber tomar un  ablandador fecal de venta libre, tal como docusate 100 mg 1 a 2 veces al da.   A partir del [address] de junio de 2017, el Gobernador firm una ley de [place] que trabaja para abordar la crisis de automotive engineer de opioides que enfrenta [place].   Se llama la Ley STOP, que en ingls significa 'Strengthen Opiod Misuse Prevention' (fortalecer la prevencin del uso indebido de opioides).     Como parte de esta ley, inicialmente estamos limitados a camera operator un suministro para no ms de pulte homes de automatic data opioide para el dolor para nuestros pacientes de operacin.    (Esta ley tambin limita el suministro de recetas para pacientes que no se han sometido a una operacin a wps resources).    Debido a que las recetas de opioides no pueden renovarse de forma electrnica o por telfono, se debe presentar una receta en papel a la farmacia.  Por lo tanto, un paciente debe comunicarse con nuestra oficina por lo menos 2-3 cox communications antes de cuando prevea que se le acabar el medicamento para el dolor para que podamos formular un plan para usted.   El mejor nmero para comunicarse con nosotros es [place] [phone].  El proveedor que est de guardia NO podr renovar su receta.   La Ley STOP tambin supervisa la cantidad de sustancias controladas que cada paciente ha recibido de todos y cada uno de los proveedores.   Las recetas para cada paciente las supervisar [place] Controlled Substances Reporting System 'Sistema de notificacin de sustancias controladas de [place]' de acuerdo con la ley.  Gracias por colaborar con nosotros para que podamos brindar un buen control del dolor de la manera ms segura posible mientras se recupera.   Por favor tenga en cuenta que no se puede llamar a la farmacia para renovar el medicamento para chief technology officer. Si cree que necesita ms medicamento para el dolor, tendrn que verlo en la clnica o en la sala de emergencias para una evaluacin ms a fondo para asegurarse de que no est  sufriendo una complicacin. Est pendiente de cuntas pastillas quedan en el frasco y si tiene pocas y cree que necesitar ms para human resources officer en forma adecuada, llame a la clnica lo antes posible para programar una cita, ya que nuestras clnicas estn muy ocupadas y no podemos garantizarle citas en la misma semana.   El nmero de telfono de la oficina de [place] de Traumatologa es  [phone].  Para emergencias durante la noche o fines de semanas, por favor llame al [phone]) y pregunte por el residente de ciruga de turno.    CUNDO DEBE LLAMAR AL CONSULTORIO DEL CIRUJANO:  1. Secrecin espesa y [address] de las incisiones, enrojecimiento en las incisiones, sangrado,  separacin de las heridas.  2. Lajune de ms de 101.5 F  3. nuseas o vmitos incontrolables;  4. Para el dolor que no se controla con el medicamento para el dolor 5. Ictericia (amarillamiento de los ojos o la piel)  Puede tomar Motrin 600 mg con el desayuno, almuerzo y cena y antes de irse a la cama para primary school teacher.   Notificacin estndar al paciente Es muy importante para nosotros que chief technology officer del  paciente despus de la operacin est controlado.   Durante el mes despus de que tenga la operacin, puede que reciba una llamada de nosotros preguntndole sobre el dolor posoperatorio y sobre cmo control ese dolor.  La encuesta tomar aproximadamente de 5 a 10 minutos y nos ayudar a proporcionarle una mejor atencin a nuestros pacientes.  Gracias de antemano por su participacin y por favor responda cualquier llamada que provenga de la localidad de [facility] o [place].   Vaya a una cita de seguimiento con su mdico de cabecera (PCP, por sus siglas en ingls)   Cita de seguimiento   Informacin y nmeros importantes:   [facility] Department of Surgery, Division of General and Acute Care Surgery  [address] [facility] [address]  [place], [place]  [address]  Durante el horario de atencin  normal:  lun-vie, 8 a. m. a 4:30 p. m. llame al:       -Informacin de contacto de las enfermeras:  [phone]:  [place]  [phone]: Heidi (Dr. Francella)  [phone]: Andres (si tiene que cambiar la fecha de la operacin)  Fax:   [phone]   Nmero de la clnica:  Para programar, reprogramar o cancelar una cita:   [phone]   De noche y en fines de semana hay que comunicarse con el mdico residente de guardia: [phone]   Si tiene algn documento Ley para ausencia familiar y mdica (FMLA por sus siglas en ingls) u otro documento que deba completarse relacionado con el trabajo, la escuela, etc., que no se haya completado durante su hospitalizacin, enve un fax al eastman chemical figura arriba [phone]) en lugar de traerlo a su cita clnica y esto ayudar a disminuir cualquier retraso en la finalizacin del papeleo.  18 de enero de 2024 a las 2:30 p. m. para analizar el curso posoperatorio y magazine features editor. Si tiene inquietudes, llame para programar una cita.  Tenga en cuenta que, si llega ms de 20 minutos tarde, tendr que reprogramar su cita.    *Para obtener apoyo e informacin sobre la toxicomana puede llamar a la lnea nacional de ayuda de [place]. Es un servicio gratis, confidencial, las 24 horas del da, [address] de la [place], 365 das del ao para remisiones para tratamiento y servicio de informacin (en ingls y health and safety inspector) para personas y sus familias que afrontan trastornos por el uso de drogas o [place]. [phone])   Interaccin de medicamentos Durante su procedimiento u operacin de hoy, recibi medicamentos que reducen la eficacia de los anticonceptivos. Debe tener esto en cuenta si est usando algn tipo de anticonceptivo hormonal: Sugammadex (Bridion) es un medicamento que ayuda a agilizar la recuperacin de los anestsicos (programmer, systems) que los pacientes reciben durante la operacin.  Sugammadex puede disminuir la eficacia del anticonceptivo  hormonal hasta por 7 das.  Utilice otro mtodo anticonceptivo complementario hasta por 7 das despus de su procedimiento u operacin. Siga tomando sus pastillas anticonceptivas o usando su anticonceptivo hormonal durante este periodo de tiempo.         Cita de seguimiento  18 de enero  de 2024 CITA POSOPERATORIA Jueves,18 de enero de 2024 2:30 p. m. (Llegar a las 2:00 p. m.) [facility] GENERAL AND ACUTE CARE SURGERY [place] [address]  [place] [address] [phone]      Motivo de la hospitalizacin Su diagnstico primario fue: Clculo en la vescula biliar    Mdicos que la atendieron durante la hospitalizacin Proveedor Servicio Funcin  Especialidad  [address] [place] Verla Dasie -- Proveedor a cargo [employer] general    Es counselling psychologist a lo siguiente [place]  Se desconoce que tenga alergias activas     Inmunizaciones admimistradas durante esta hospitalizacin  Nombre Fecha Dosis Fecha de Declaracin Informativa sobre la vacuna Ruta  Influenza Vaccine Quad (IM) 6 meses - adulto (PF)  Fabricante: ID Biomedical       deferida 0.5 mL 8 de junio, 2021 Intramuscular    Lista de medicamentos   Por la maana Por la tarde Por la noche A la hora de irse a dormir Cuando sea necesario  COMIENCE a tomar: acetaminophen  325 MG tablet Comnmente conocido como: TYLENOL  Tome 2 pastillas por va oral cada 6 horas. (Dosis = 650 mg) Use durante 7 das. ltima vez que se administr: 650 mg el 13 de enero de 2024 a las 11:04 a. m.        COMIENCE a tomar: docusate sodium 100 MG capsule Comnmente conocido como: COLACE Tome 1 cpsula (100 mg en total) por va oral al da durante 7 das. selo mientras tome opioides para evitar el estreimiento. ltima vez que se administr: 100 mg el 13 de enero de 2024 a las 8:12 a. m.        COMIENCE a tomar: * HEALTHYLAX 17 gram packet Mezcle 1 paquete (17 g) en 4 a 8 onzas de lquido y bbalo por va oral diariamente durante 7 das.  selo mientras toma opioides para evitar el estreimiento. Medicamento genrico: polietilenglicol.        COMIENCE a tomar: * polyethylene glycol 17 gram packet Comnmente conocido como: MIRALAX Mezcle 1 paquete (17 g) en 4 a 8 oz de lquido y bbalo por va oral diariamente segn sea necesario (estreimiento).         levothyroxine 125 MCG tablet Comnmente conocido como:SYNTHROID Tome 1 tableta (125 mcg en total) por va oral al da. ltima vez que se administr: 125 mcg el 13 de enero de 2024 a las 8:12 a. m.        COMIENCE a tomar: LIDOCAINE PAIN RELIEF 4 % patch Coloque 1 parche sobre la piel diariamente durante 5 [place]. selo solo durante 12 horas cada da, ltima vez que se proporcion: 1 parche el 13 de enero de 2024 a las 8:08 a. m. Medicamento genrico: lidocana.        COMIENCE a tomar: oxyCODONE 5 MG immediate release tablet Comnmente conocido como: ROXICODONE Tome 1 tableta (5 mg en total) por va oral cada cuatro (4) horas, segn sea necesario, hasta 8 dosis. ltima vez que se administr: 5 mg el 12 de enero de 2024 a las 6:10 p. m.         VITAL-D RX ORAL Tome por va oral        *Esta lista tiene 2 medicamentos que son los mismos que otros medicamentos que se le han recetado. Lea las instrucciones con cuidado y pregunte a su doctor o algn otro proveedor de cuidados que los revise con usted.        Dnde recoger los medicamentos                 Recoja estos medicamentos en [facility] CENTRAL OUT-PT PHARMACY  acetaminophen  . docusate sodium . HEALTHYLAX . LIDOCAINE PAIN RELIEF . oxyCODONE . polyethylene glycol               Direccin: [address] [place] [address]  Horario: de las 7 a. m. a  las 8 p. m. de lunes a viernes, desde las 8:30 a. m. hasta las 4 p. m. los sbados y domingos (solo para pacientes que se dan de alta)  Telfono: [phone]      Tylenol /Acetaminophen         Administraciones (las ltimas 24 horas)     Fecha/Hora Accin  Medicamento Dosis    13 de enero de  2024  1104 horas Administrado    acetaminophen  (TYLENOL ) tablet 650 mg 650 mg    13 de enero de  2024 9461 horas Administrado    acetaminophen  (TYLENOL ) tablet 650 mg 650 mg    12 de enero de  2024 2109 horas Administrado    acetaminophen  (TYLENOL ) tablet 650 mg 650 mg                                         Informacin adjunta Informacin de recursos ante una crisis: Lneas directas nacionales de prevencin del suicidio: [phone]   Lneas de atencin ante una crisis de [place]:  [phone]  MyChart Enve mensajes al mdico, revise los [place] de pruebas mdicas, renueve las [place], haga citas y mucho ms!   Vaya a [email or web address] y haga clic en Activate Your Account. Escriba su cdigo de activacin de My [facility] exactamente como aparece a continuacin junto con su fecha de nacimiento para completar el proceso de activacin.    No se gener un cdigo de [address] actual de MyChart: Activo  Si necesita ayuda con My [facility] Chart, llame a [facility] al [phone].   Care Everywhere CEID [id number]: Este nmero de identificacin se puede usar si otra instalacin mdica que utiliza el programa Epic necesita solicitar el expediente mdico de [place].

## 2022-12-12 ENCOUNTER — Encounter: Payer: Self-pay | Admitting: Physician Assistant

## 2023-02-12 ENCOUNTER — Other Ambulatory Visit: Payer: Self-pay

## 2023-02-12 DIAGNOSIS — Z1231 Encounter for screening mammogram for malignant neoplasm of breast: Secondary | ICD-10-CM

## 2023-02-25 ENCOUNTER — Ambulatory Visit: Payer: Self-pay

## 2024-03-27 ENCOUNTER — Emergency Department
Admission: EM | Admit: 2024-03-27 | Discharge: 2024-03-28 | Disposition: A | Discharge status: Other Acute Inpatient Hospital | Payer: Self-pay | Attending: Emergency Medicine | Admitting: Emergency Medicine

## 2024-03-27 ENCOUNTER — Emergency Department: Payer: Self-pay

## 2024-03-27 ENCOUNTER — Other Ambulatory Visit: Payer: Self-pay

## 2024-03-27 DIAGNOSIS — K8050 Calculus of bile duct without cholangitis or cholecystitis without obstruction: Secondary | ICD-10-CM | POA: Insufficient documentation

## 2024-03-27 DIAGNOSIS — R7401 Elevation of levels of liver transaminase levels: Secondary | ICD-10-CM | POA: Insufficient documentation

## 2024-03-27 DIAGNOSIS — D649 Anemia, unspecified: Secondary | ICD-10-CM | POA: Insufficient documentation

## 2024-03-27 DIAGNOSIS — D72829 Elevated white blood cell count, unspecified: Secondary | ICD-10-CM | POA: Insufficient documentation

## 2024-03-27 LAB — COMPREHENSIVE METABOLIC PANEL WITH GFR
ALT: 230 U/L — ABNORMAL HIGH (ref 0–44)
AST: 388 U/L — ABNORMAL HIGH (ref 15–41)
Albumin: 4.3 g/dL (ref 3.5–5.0)
Alkaline Phosphatase: 129 U/L — ABNORMAL HIGH (ref 38–126)
Anion gap: 11 (ref 5–15)
BUN: 12 mg/dL (ref 6–20)
CO2: 26 mmol/L (ref 22–32)
Calcium: 9.7 mg/dL (ref 8.9–10.3)
Chloride: 100 mmol/L (ref 98–111)
Creatinine, Ser: 0.45 mg/dL (ref 0.44–1.00)
GFR, Estimated: >60 mL/min (ref >60)
Glucose, Bld: 117 mg/dL — ABNORMAL HIGH (ref 70–99)
Potassium: 3.2 mmol/L — ABNORMAL LOW (ref 3.5–5.1)
Sodium: 137 mmol/L (ref 135–145)
Total Bilirubin: 1.2 mg/dL (ref 0.0–1.2)
Total Protein: 7.9 g/dL (ref 6.5–8.1)

## 2024-03-27 LAB — URINALYSIS, ROUTINE W REFLEX MICROSCOPIC
Bilirubin Urine: NEGATIVE
Glucose, UA: NEGATIVE mg/dL
Hgb urine dipstick: NEGATIVE
Ketones, ur: NEGATIVE mg/dL
Leukocytes,Ua: NEGATIVE
Nitrite: NEGATIVE
Protein, ur: NEGATIVE mg/dL
Specific Gravity, Urine: 1.014 (ref 1.005–1.030)
pH: 7 (ref 5.0–8.0)

## 2024-03-27 LAB — ACETAMINOPHEN LEVEL: Acetaminophen (Tylenol), Serum: <10 ug/mL — ABNORMAL LOW (ref 10–30)

## 2024-03-27 LAB — CBC
HCT: 33.4 % — ABNORMAL LOW (ref 36.0–46.0)
Hemoglobin: 10.4 g/dL — ABNORMAL LOW (ref 12.0–15.0)
MCH: 25.2 pg — ABNORMAL LOW (ref 26.0–34.0)
MCHC: 31.1 g/dL (ref 30.0–36.0)
MCV: 81.1 fL (ref 80.0–100.0)
Platelets: 467 K/uL — ABNORMAL HIGH (ref 150–400)
RBC: 4.12 MIL/uL (ref 3.87–5.11)
RDW: 14.8 % (ref 11.5–15.5)
WBC: 14.3 K/uL — ABNORMAL HIGH (ref 4.0–10.5)
nRBC: 0 % (ref 0.0–0.2)

## 2024-03-27 LAB — LIPASE, BLOOD: Lipase: 31 U/L (ref 11–51)

## 2024-03-27 LAB — PROTIME-INR
INR: 1 (ref 0.8–1.2)
Prothrombin Time: 13.8 s (ref 11.4–15.2)

## 2024-03-27 LAB — PREGNANCY, URINE: Preg Test, Ur: NEGATIVE

## 2024-03-27 MED ORDER — GADOBUTROL 1 MMOL/ML IV SOLN
6.0000 mL | Freq: Once | INTRAVENOUS | Status: AC | PRN
  Administered 2024-03-27: 6 mL via INTRAVENOUS

## 2024-03-27 MED ORDER — IOHEXOL 300 MG/ML  SOLN
100.0000 mL | Freq: Once | INTRAMUSCULAR | Status: AC | PRN
  Administered 2024-03-27: 100 mL via INTRAVENOUS

## 2024-03-27 MED ORDER — SODIUM CHLORIDE 0.9 % IV SOLN: INTRAVENOUS | Status: DC

## 2024-03-27 MED ORDER — SODIUM CHLORIDE 0.9 % IV BOLUS
1000.0000 mL | Freq: Once | INTRAVENOUS | Status: AC
  Administered 2024-03-27: 1000 mL via INTRAVENOUS

## 2024-03-27 MED ORDER — SODIUM CHLORIDE 0.9 % IV SOLN
12.5000 mg | Freq: Once | INTRAVENOUS | Status: AC
  Administered 2024-03-27: 12.5 mg via INTRAVENOUS
  Filled 2024-03-27: qty 12.5

## 2024-03-27 MED ORDER — PIPERACILLIN-TAZOBACTAM 3.375 G IVPB 30 MIN
3.3750 g | Freq: Once | INTRAVENOUS | Status: AC
  Administered 2024-03-27: 3.375 g via INTRAVENOUS
  Filled 2024-03-27 (×2): qty 50

## 2024-03-27 MED ORDER — MORPHINE SULFATE (PF) 4 MG/ML IV SOLN
4.0000 mg | Freq: Once | INTRAVENOUS | Status: AC
  Administered 2024-03-27: 4 mg via INTRAVENOUS
  Filled 2024-03-27: qty 1

## 2024-03-27 MED ORDER — ACETAMINOPHEN 325 MG PO TABS
650.0000 mg | ORAL_TABLET | Freq: Once | ORAL | Status: AC
  Administered 2024-03-27: 650 mg via ORAL
  Filled 2024-03-27: qty 2

## 2024-03-27 MED ORDER — ONDANSETRON HCL 4 MG/2ML IJ SOLN
4.0000 mg | Freq: Once | INTRAMUSCULAR | Status: AC
  Administered 2024-03-27: 4 mg via INTRAVENOUS
  Filled 2024-03-27: qty 2

## 2024-03-27 NOTE — ED Provider Notes (Signed)
 11:00 PM  Assumed care at shift change.  Patient here with choledocholithiasis confirmed by MRCP.  Does have a leukocytosis of 14,000 but is afebrile.  She has had previous cholecystectomy.  Receiving IV Zosyn in case there is developing cholangitis although she is well-appearing, nontoxic.  N.p.o. at this time.  Dr. Levander has reached out to both [facility] and [facility] for potential transfer and they are unable to accept patient due to bed capacity constraints.  We are waiting to hear back from [facility] gastroenterology.   12:00 AM  Discussed with Dr. Avram with gastroenterology at [facility].  Appreciate his help.  He agrees to see patient in consultation and recommends admission to the hospitalist service at [facility].  12:53 AM  Spoke with Dr. Freada with hospitalist service who accept patient to medical/surgical bed at [facility].  Appreciate his help.  [facility] will arrange transport.   Bianca Vester, Josette SAILOR, DO 03/28/24 [phone]

## 2024-03-27 NOTE — ED Triage Notes (Signed)
 Pt to ED for mid upper abdominal pain that radiates to back since this AM and vomited 1 time. Pain also radiates to rest of abdomen. Pain is sharp. States pain feels like when she had gallstones but GB was taken out. Denies dysuria.

## 2024-03-27 NOTE — ED Provider Notes (Signed)
 [facility] Provider Note    Event Date/Time   First MD Initiated Contact with Patient 03/27/24 1457     (approximate)   History   Abdominal Pain, Emesis, and Back Pain   HPI  Mackenzie Douglas is a 43 year old female presenting to the ER for evaluation of abdominal pain.  Started this morning located in mid upper abdomen with associated nausea and vomiting.  Feels similar to when she had cholecystitis, had her gallbladder removed in 2024.    Physical Exam   Triage Vital Signs: ED Triage Vitals  Encounter Vitals Group     BP 03/27/24 1400 114/87     Girls Systolic BP Percentile --      Girls Diastolic BP Percentile --      Boys Systolic BP Percentile --      Boys Diastolic BP Percentile --      Pulse Rate 03/27/24 1400 (!) 110     Resp 03/27/24 1400 20     Temp 03/27/24 1400 98.3 F (36.8 C)     Temp Source 03/27/24 1400 Oral     SpO2 03/27/24 1400 100 %     Weight 03/27/24 1358 138 lb (62.6 kg)     Height 03/27/24 1358 5' 2 (1.575 m)     Head Circumference --      Peak Flow --      Pain Score 03/27/24 1356 10     Pain Loc --      Pain Education --      Exclude from Growth Chart --     Most recent vital signs: Vitals:   03/27/24 2000 03/27/24 2136  BP:  118/69  Pulse: 73 67  Resp:  16  Temp:  98.6 F (37 C)  SpO2: 97% 99%     General: Awake, interactive  CV:  Good peripheral perfusion Resp:  Unlabored respirations Abd:  Nondistended soft, tender to palpation most notably in the epigastric and right upper quadrant without rebound or guarding,  Neuro:  Symmetric facial movement, fluid speech   ED Results / Procedures / Treatments   Labs (all labs ordered are listed, but only abnormal results are displayed) Labs Reviewed  COMPREHENSIVE METABOLIC PANEL WITH GFR - Abnormal; Notable for the following components:      Result Value   Potassium 3.2 (*)    Glucose, Bld 117 (*)    AST 388 (*)    ALT 230 (*)    Alkaline  Phosphatase 129 (*)    All other components within normal limits  CBC - Abnormal; Notable for the following components:   WBC 14.3 (*)    Hemoglobin 10.4 (*)    HCT 33.4 (*)    MCH 25.2 (*)    Platelets 467 (*)    All other components within normal limits  URINALYSIS, ROUTINE W REFLEX MICROSCOPIC - Abnormal; Notable for the following components:   Color, Urine YELLOW (*)    APPearance CLOUDY (*)    All other components within normal limits  ACETAMINOPHEN  LEVEL - Abnormal; Notable for the following components:   Acetaminophen  (Tylenol ), Serum <10 (*)    All other components within normal limits  LIPASE, BLOOD  PREGNANCY, URINE  PROTIME-INR  POC URINE PREG, ED     EKG EKG independently reviewed and interpreted by myself demonstrates:    RADIOLOGY Imaging independently reviewed and interpreted by myself demonstrates:  CT abdomen pelvis demonstrates findings concerning for choledocholithiasis with CBD dilation and pneumobilia  Formal Radiology  Read:  CT ABDOMEN PELVIS W CONTRAST Result Date: 03/27/2024 EXAM: CT ABDOMEN AND PELVIS WITH CONTRAST 03/27/2024 05:35:42 PM TECHNIQUE: CT of the abdomen and pelvis was performed with the administration of 100 mL of iohexol (OMNIPAQUE) 300 MG/ML solution. Multiplanar reformatted images are provided for review. Automated exposure control, iterative reconstruction, and/or weight-based adjustment of the mA/kV was utilized to reduce the radiation dose to as low as reasonably achievable. COMPARISON: None available. CLINICAL HISTORY: Abdominal pain, acute, nonlocalized. FINDINGS: LOWER CHEST: No acute abnormality. LIVER: Mild intrahepatic biliary ductal dilatation and pneumobilia. GALLBLADDER AND BILE DUCTS: Gallbladder is surgically absent. Choledocholithiasis is present. The common bile duct is dilated measuring up to 12 mm. SPLEEN: No acute abnormality. PANCREAS: No acute abnormality. ADRENAL GLANDS: No acute abnormality. KIDNEYS, URETERS AND BLADDER:  There is a 9 mm right renal cyst. Per consensus, no follow-up is needed for simple Bosniak type 1 and 2 renal cysts, unless the patient has a malignancy history or risk factors. No stones in the kidneys or ureters. No hydronephrosis. No perinephric or periureteral stranding. Urinary bladder is unremarkable. GI AND BOWEL: Stomach demonstrates no acute abnormality. Appendix is normal. There is no bowel obstruction. PERITONEUM AND RETROPERITONEUM: No ascites. No free air. VASCULATURE: Aorta is normal in caliber. LYMPH NODES: No lymphadenopathy. REPRODUCTIVE ORGANS: There is a small amount of fluid in the endometrial canal. There is a 9 cm heterogeneous mass in posterior uterus, likely a fibroid. There is some central areas of hyperdensity and internal hemorrhage cannot be excluded within this fibroid. Adnexa are within normal limits. BONES AND SOFT TISSUES: No acute osseous abnormality. No focal soft tissue abnormality. IMPRESSION: 1. Choledocholithiasis with dilated common bile duct (up to 12 mm), mild intrahepatic biliary ductal dilatation, and pneumobilia  recommend correlation with prior imaging and clinical evaluation for possible biliary obstruction; consider hepatobiliary ultrasound or MRCP and gastroenterology/hepatobiliary consultation as clinically indicated. 2. 9 cm heterogeneous mass in posterior uterus, likely a fibroid, with possible internal hemorrhage  recommend gynecologic correlation; consider pelvic ultrasound or MRI with contrast for further characterization and management. 3. 9 mm right renal cyst  simple-appearing; no follow-up recommended. 4. Small amount of fluid in the endometrial canal  correlate with menstrual history; gynecologic follow-up as indicated. Electronically signed by: Greig Pique MD 03/27/2024 06:12 PM EST RP Workstation: [id number]    PROCEDURES:  Critical Care performed: No  Procedures   MEDICATIONS ORDERED IN ED: Medications  piperacillin-tazobactam (ZOSYN)  IVPB 3.375 g (has no administration in time range)  sodium chloride 0.9 % bolus 1,000 mL (0 mLs Intravenous Stopped 03/27/24 1652)  ondansetron  (ZOFRAN ) injection 4 mg (4 mg Intravenous Given 03/27/24 1505)  morphine (PF) 4 MG/ML injection 4 mg (4 mg Intravenous Given 03/27/24 1607)  iohexol (OMNIPAQUE) 300 MG/ML solution 100 mL (100 mLs Intravenous Contrast Given 03/27/24 1720)  promethazine (PHENERGAN) 12.5 mg in sodium chloride 0.9 % 50 mL IVPB (0 mg Intravenous Stopped 03/27/24 1816)  acetaminophen  (TYLENOL ) tablet 650 mg (650 mg Oral Given 03/27/24 1849)  gadobutrol (GADAVIST) 1 MMOL/ML injection 6 mL (6 mLs Intravenous Contrast Given 03/27/24 2120)     IMPRESSION / MDM / ASSESSMENT AND PLAN / ED COURSE  I reviewed the triage vital signs and the nursing notes.  Differential diagnosis includes, but is not limited to, choledocholithiasis, pancreatitis, gastritis, other acute intra-abdominal process  Patient's presentation is most consistent with acute presentation with potential threat to life or bodily function.  43 year old female presenting to the emergency department for evaluation of upper abdominal pain,  prior cholecystectomy.  Stable vitals on presentation.  Labs with leukocytosis WC 14.3, mild anemia, CMP notable for transaminitis with elevated ALT and AST but normal T. bili.  UA without evidence of infection.  Normal lipase.  Normal INR.  CT concerning for choledocholithiasis.  MRCP ordered to further evaluate.  Clinical Course as of 03/27/24 7668  [place] Mar 27, 2024  2152 Wet read on MRCP from Dr. Evelyne- dilated CBD with suspected choledocholithiasis, sludge, and suspected pneumobilia [NR]  2212 Discussed results of workup with patient.  No ERCP services or hospital, discussed transfer to outside hospital for management.  Prefers [facility] then [facility] then [facility]. Will have secretary contact transfer centers.  [NR]  2225 D/w with Randall with [facility] transfer center. Reports no ERCP coverage on  weekends and their ERCP slots are full for next week. Will try [facility]. [NR]  2236 [facility] declined due to capacity [NR]  2330 Signed out to oncoming physician pending discussion with [facility] for possible transfer.  If capacity may need to be admitted here for further management of her choledocholithiasis while awaiting ERCP.  Based on schedule, should have ERCP coverage on Monday. [NR]    Clinical Course User Index [NR] Levander Slate, MD     FINAL CLINICAL IMPRESSION(S) / ED DIAGNOSES   Final diagnoses:  Choledocholithiasis     Rx / DC Orders   ED Discharge Orders     None        Note:  This document was prepared using Dragon voice recognition software and may include unintentional dictation errors.   Levander Slate, MD 03/27/24 [phone]

## 2024-03-28 ENCOUNTER — Inpatient Hospital Stay (HOSPITAL_COMMUNITY)
Admission: EM | Admit: 2024-03-28 | Discharge: 2024-03-30 | DRG: 446 | Disposition: A | Discharge status: Home / Self Care | Payer: Self-pay | Source: Other Acute Inpatient Hospital | Attending: Internal Medicine | Admitting: Internal Medicine

## 2024-03-28 ENCOUNTER — Encounter (HOSPITAL_COMMUNITY): Payer: Self-pay | Admitting: Internal Medicine

## 2024-03-28 ENCOUNTER — Inpatient Hospital Stay (HOSPITAL_COMMUNITY): Payer: Self-pay

## 2024-03-28 ENCOUNTER — Encounter (HOSPITAL_COMMUNITY): Payer: Self-pay

## 2024-03-28 DIAGNOSIS — R1013 Epigastric pain: Secondary | ICD-10-CM

## 2024-03-28 DIAGNOSIS — E876 Hypokalemia: Secondary | ICD-10-CM | POA: Diagnosis present

## 2024-03-28 DIAGNOSIS — R748 Abnormal levels of other serum enzymes: Secondary | ICD-10-CM

## 2024-03-28 DIAGNOSIS — K760 Fatty (change of) liver, not elsewhere classified: Secondary | ICD-10-CM | POA: Diagnosis present

## 2024-03-28 DIAGNOSIS — Z79899 Other long term (current) drug therapy: Secondary | ICD-10-CM

## 2024-03-28 DIAGNOSIS — R109 Unspecified abdominal pain: Principal | ICD-10-CM | POA: Diagnosis present

## 2024-03-28 DIAGNOSIS — Z5941 Food insecurity: Secondary | ICD-10-CM

## 2024-03-28 DIAGNOSIS — K8050 Calculus of bile duct without cholangitis or cholecystitis without obstruction: Principal | ICD-10-CM | POA: Diagnosis present

## 2024-03-28 DIAGNOSIS — D259 Leiomyoma of uterus, unspecified: Secondary | ICD-10-CM | POA: Diagnosis present

## 2024-03-28 DIAGNOSIS — K8051 Calculus of bile duct without cholangitis or cholecystitis with obstruction: Secondary | ICD-10-CM | POA: Diagnosis present

## 2024-03-28 DIAGNOSIS — E669 Obesity, unspecified: Secondary | ICD-10-CM | POA: Diagnosis present

## 2024-03-28 DIAGNOSIS — Z6825 Body mass index (BMI) 25.0-25.9, adult: Secondary | ICD-10-CM

## 2024-03-28 DIAGNOSIS — E039 Hypothyroidism, unspecified: Secondary | ICD-10-CM | POA: Diagnosis present

## 2024-03-28 DIAGNOSIS — K838 Other specified diseases of biliary tract: Secondary | ICD-10-CM

## 2024-03-28 DIAGNOSIS — D649 Anemia, unspecified: Secondary | ICD-10-CM | POA: Diagnosis present

## 2024-03-28 DIAGNOSIS — Z9049 Acquired absence of other specified parts of digestive tract: Secondary | ICD-10-CM

## 2024-03-28 DIAGNOSIS — N281 Cyst of kidney, acquired: Secondary | ICD-10-CM | POA: Diagnosis present

## 2024-03-28 HISTORY — DX: Calculus of bile duct without cholangitis or cholecystitis without obstruction: K80.50

## 2024-03-28 HISTORY — DX: Calculus of gallbladder without cholecystitis without obstruction: K80.20

## 2024-03-28 LAB — CBC WITH DIFFERENTIAL/PLATELET
Abs Immature Granulocytes: 0.04 K/uL (ref 0.00–0.07)
Basophils Absolute: 0 K/uL (ref 0.0–0.1)
Basophils Relative: 0 %
Eosinophils Absolute: 0 K/uL (ref 0.0–0.5)
Eosinophils Relative: 0 %
HCT: 32.1 % — ABNORMAL LOW (ref 36.0–46.0)
Hemoglobin: 10.1 g/dL — ABNORMAL LOW (ref 12.0–15.0)
Immature Granulocytes: 0 %
Lymphocytes Relative: 10 %
Lymphs Abs: 1 K/uL (ref 0.7–4.0)
MCH: 25.2 pg — ABNORMAL LOW (ref 26.0–34.0)
MCHC: 31.5 g/dL (ref 30.0–36.0)
MCV: 80 fL (ref 80.0–100.0)
Monocytes Absolute: 0.5 K/uL (ref 0.1–1.0)
Monocytes Relative: 5 %
Neutro Abs: 8.2 K/uL — ABNORMAL HIGH (ref 1.7–7.7)
Neutrophils Relative %: 85 %
Platelets: 433 K/uL — ABNORMAL HIGH (ref 150–400)
RBC: 4.01 MIL/uL (ref 3.87–5.11)
RDW: 14.9 % (ref 11.5–15.5)
WBC: 9.8 K/uL (ref 4.0–10.5)
nRBC: 0 % (ref 0.0–0.2)

## 2024-03-28 LAB — COMPREHENSIVE METABOLIC PANEL WITH GFR
ALT: 681 U/L — ABNORMAL HIGH (ref 0–44)
AST: 485 U/L — ABNORMAL HIGH (ref 15–41)
Albumin: 3.4 g/dL — ABNORMAL LOW (ref 3.5–5.0)
Alkaline Phosphatase: 132 U/L — ABNORMAL HIGH (ref 38–126)
Anion gap: 11 (ref 5–15)
BUN: 7 mg/dL (ref 6–20)
CO2: 26 mmol/L (ref 22–32)
Calcium: 9 mg/dL (ref 8.9–10.3)
Chloride: 101 mmol/L (ref 98–111)
Creatinine, Ser: 0.66 mg/dL (ref 0.44–1.00)
GFR, Estimated: >60 mL/min (ref >60)
Glucose, Bld: 109 mg/dL — ABNORMAL HIGH (ref 70–99)
Potassium: 3.4 mmol/L — ABNORMAL LOW (ref 3.5–5.1)
Sodium: 138 mmol/L (ref 135–145)
Total Bilirubin: 1.1 mg/dL (ref 0.0–1.2)
Total Protein: 6.6 g/dL (ref 6.5–8.1)

## 2024-03-28 LAB — TYPE AND SCREEN
ABO/RH(D): A POS
Antibody Screen: NEGATIVE

## 2024-03-28 LAB — MAGNESIUM: Magnesium: 1.6 mg/dL — ABNORMAL LOW (ref 1.7–2.4)

## 2024-03-28 LAB — IRON AND TIBC
Iron: 61 ug/dL (ref 28–170)
Saturation Ratios: 15 % (ref 10.4–31.8)
TIBC: 413 ug/dL (ref 250–450)
UIBC: 352 ug/dL

## 2024-03-28 LAB — ABO/RH: ABO/RH(D): A POS

## 2024-03-28 LAB — PROTIME-INR
INR: 1.2 (ref 0.8–1.2)
Prothrombin Time: 15.4 s — ABNORMAL HIGH (ref 11.4–15.2)

## 2024-03-28 LAB — RETICULOCYTES
Immature Retic Fract: 13.8 % (ref 2.3–15.9)
RBC.: 3.71 MIL/uL — ABNORMAL LOW (ref 3.87–5.11)
Retic Count, Absolute: 30.8 K/uL (ref 19.0–186.0)
Retic Ct Pct: 0.8 % (ref 0.4–3.1)

## 2024-03-28 LAB — HIV ANTIBODY (ROUTINE TESTING W REFLEX): HIV Screen 4th Generation wRfx: NONREACTIVE

## 2024-03-28 LAB — FOLATE: Folate: 9.4 ng/mL (ref >5.9)

## 2024-03-28 LAB — LACTIC ACID, PLASMA: Lactic Acid, Venous: 0.9 mmol/L (ref 0.5–1.9)

## 2024-03-28 LAB — FERRITIN: Ferritin: 12 ng/mL (ref 11–307)

## 2024-03-28 LAB — VITAMIN B12: Vitamin B-12: 540 pg/mL (ref 180–914)

## 2024-03-28 MED ORDER — SODIUM CHLORIDE 0.9% FLUSH
3.0000 mL | Freq: Two times a day (BID) | INTRAVENOUS | Status: DC
  Administered 2024-03-28 – 2024-03-30 (×4): 3 mL via INTRAVENOUS

## 2024-03-28 MED ORDER — MORPHINE SULFATE (PF) 2 MG/ML IV SOLN: 2.0000 mg | INTRAVENOUS | Status: DC | PRN

## 2024-03-28 MED ORDER — SODIUM CHLORIDE 0.9 % IV SOLN: INTRAVENOUS | Status: DC

## 2024-03-28 MED ORDER — HEPARIN SODIUM (PORCINE) 5000 UNIT/ML IJ SOLN
5000.0000 [IU] | Freq: Three times a day (TID) | INTRAMUSCULAR | Status: DC
  Administered 2024-03-28: 5000 [IU] via SUBCUTANEOUS
  Filled 2024-03-28: qty 1

## 2024-03-28 MED ORDER — HYDROCODONE-ACETAMINOPHEN 5-325 MG PO TABS
1.0000 | ORAL_TABLET | ORAL | Status: DC | PRN
  Administered 2024-03-28 – 2024-03-29 (×2): 1 via ORAL
  Filled 2024-03-28 (×2): qty 1

## 2024-03-28 MED ORDER — POTASSIUM CHLORIDE CRYS ER 20 MEQ PO TBCR
40.0000 meq | EXTENDED_RELEASE_TABLET | Freq: Once | ORAL | Status: AC
  Administered 2024-03-28: 40 meq via ORAL

## 2024-03-28 MED ORDER — ACETAMINOPHEN 325 MG PO TABS
650.0000 mg | ORAL_TABLET | Freq: Four times a day (QID) | ORAL | Status: DC | PRN
  Filled 2024-03-28: qty 2

## 2024-03-28 MED ORDER — PANTOPRAZOLE SODIUM 40 MG IV SOLR
40.0000 mg | Freq: Two times a day (BID) | INTRAVENOUS | Status: DC
  Administered 2024-03-28 – 2024-03-30 (×4): 40 mg via INTRAVENOUS
  Filled 2024-03-28 (×4): qty 10

## 2024-03-28 MED ORDER — ACETAMINOPHEN 650 MG RE SUPP: 650.0000 mg | Freq: Four times a day (QID) | RECTAL | Status: DC | PRN

## 2024-03-28 NOTE — ED Notes (Signed)
EMTALA reivewed by this RN.

## 2024-03-28 NOTE — H&P (View-Only) (Signed)
 Consultation  Referring Provider: Levander Slate, MD Primary Care Physician:  Cecily Katz, PA-C Primary Gastroenterologist:  Sampson  Reason for Consultation: Acute abdominal pain/nausea and vomiting/choledocholithiasis  HPI: Mackenzie Douglas is a 43 y.o. non-English-speaking Spanish   female who had presented to [facility] emergency room with complaints of acute abdominal pain yesterday associated with nausea and vomiting.  Interview was done today with the assistance of the mobile video interpreter.  She had described the pain as being similar to her previous gallbladder symptoms.  She underwent extensive workup through the emergency room including CT of the abdomen and pelvis which showed mild intrahepatic ductal dilation, and pneumobilia, gallbladder absent, CBD 12 mm with choledocholithiasis present, unremarkable pancreas.  Noted a 9 cm heterogeneous mass in the uterus probable fibroid.  MRI/MRCP shows hepatic steatosis, there is an indeterminant lesion 1.7 x 1.1 cm hypointense in hepatic segment 3, MRI in 3 months suggested.  There is an impacted common bile duct stone at the level of the pancreatic head measuring 1.5 cm and moderate intrahepatic bile duct dilation with possible enhancement.  There is no ERCP/biliary backup at [facility] this weekend so Dr. Enid was contacted last night and excepted patient for transfer here.  Labs today WBC 9.8/Hb 10.1/hematocrit 32.1/MCV 80 Magnesium 1.6 Potassium 3.4/BUN 7/creatinine 0.66 T. bili 1.1/alk phos 132/AST 45/ALT 681 Iron studies within normal limits   Patient does have history of previous acute severe chronic cholecystitis for which she underwent laparoscopic cholecystectomy in January 2024 at [facility].  No IOC was done at that time but there was noted spilling of gallstones at the time of the procedure.  Patient says she feels better today and is actually not having any pain at present she would like to try something to eat,  no nausea or vomiting.  She remains afebrile.   Past Medical History:  Diagnosis Date   Hypothyroid     Past Surgical History:  Procedure Laterality Date   CHOLECYSTECTOMY      Prior to Admission medications   Medication Sig Start Date End Date Taking? Authorizing Provider  EUTHYROX 125 MCG tablet Take 125 mcg by mouth daily. 07/03/19   [provider]  guaiFENesin -codeine  100-10 MG/5ML syrup Take 5 mLs by mouth every 6 (six) hours as needed for cough. 09/25/19   Dorothyann Drivers, MD  ondansetron  (ZOFRAN ) 4 MG tablet Take 1 tablet (4 mg total) by mouth daily as needed for nausea or vomiting. 09/25/19   Dorothyann Drivers, MD  predniSONE  (DELTASONE ) 10 MG tablet Take 1 tablet (10 mg total) by mouth daily. Day 1-3: take 4 tablets PO daily Day 4-6: take 3 tablets PO daily Day 7-9: take 2 tablets PO daily Day 10-12: take 1 tablet PO daily 09/25/19   Dorothyann Drivers, MD    Current Facility-Administered Medications  Medication Dose Route Frequency Provider Last Rate Last Admin   0.9 %  sodium chloride infusion   Intravenous Continuous Tobie Mario GAILS, MD 100 mL/hr at 03/28/24 0615 New Bag at 03/28/24 0615   acetaminophen  (TYLENOL ) tablet 650 mg  650 mg Oral Q6H PRN Patel, Ekta V, MD       Or   acetaminophen  (TYLENOL ) suppository 650 mg  650 mg Rectal Q6H PRN Patel, Ekta V, MD       heparin injection 5,000 Units  5,000 Units Subcutaneous Q8H Patel, Ekta V, MD   5,000 Units at 03/28/24 0621   HYDROcodone-acetaminophen  (NORCO/VICODIN) 5-325 MG per tablet 1 tablet  1 tablet Oral Q4H  PRN Patel, Ekta V, MD       morphine (PF) 2 MG/ML injection 2 mg  2 mg Intravenous Q4H PRN Patel, Ekta V, MD       pantoprazole (PROTONIX) injection 40 mg  40 mg Intravenous Q12H Patel, Ekta V, MD   40 mg at 03/28/24 9379   potassium chloride SA (KLOR-CON M) CR tablet 40 mEq  40 mEq Oral Once Krishnan, Gokul, MD       sodium chloride flush (NS) 0.9 % injection 3 mL  3 mL Intravenous Q12H Tobie Mario GAILS, MD         Allergies as of 03/28/2024   (No Known Allergies)    No family history on file.  Social History   Socioeconomic History   Marital status: Married    Spouse name: Not on file   Number of children: Not on file   Years of education: Not on file   Highest education level: Not on file  Occupational History   Not on file  Tobacco Use   Smoking status: Never   Smokeless tobacco: Never  Vaping Use   Vaping status: Never Used  Substance and Sexual Activity   Alcohol use: Not Currently    Comment: occasionally   Drug use: Never   Sexual activity: Not on file  Other Topics Concern   Not on file  Social History Narrative   Not on file   Social Drivers of Health   Financial Resource Strain: Not on file  Food Insecurity: Food Insecurity Present (03/28/2024)   Hunger Vital Sign    Worried About Running Out of Food in the Last Year: Sometimes true    Ran Out of Food in the Last Year: Sometimes true  Transportation Needs: No Transportation Needs (03/28/2024)   PRAPARE - Administrator, Civil Service (Medical): No    Lack of Transportation (Non-Medical): No  Physical Activity: Not on file  Stress: Not on file  Social Connections: Not on file  Intimate Partner Violence: Not At Risk (03/28/2024)   Humiliation, Afraid, Rape, and Kick questionnaire    Fear of Current or Ex-Partner: No    Emotionally Abused: No    Physically Abused: No    Sexually Abused: No    Review of Systems: Pertinent positive and negative review of systems were noted in the above HPI section.  All other review of systems was otherwise negative.   Physical Exam: Vital signs in last 24 hours: Temp:  [98.3 F (36.8 C)-98.9 F (37.2 C)] 98.5 F (36.9 C) (11/08 0757) Pulse Rate:  [61-110] 76 (11/08 0757) Resp:  [16-20] 17 (11/08 0757) BP: (91-118)/(50-87) 96/51 (11/08 0757) SpO2:  [97 %-100 %] 98 % (11/08 0757) Weight:  [62.6 kg-63.1 kg] 63.1 kg (11/08 0500) Last BM Date :  03/27/24 General:   Alert,  Well-developed, well-nourished, Hispanic female pleasant and cooperative in NAD Head:  Normocephalic and atraumatic. Eyes:  Sclera clear, no icterus.   Conjunctiva pink. Ears:  Normal auditory acuity. Nose:  No deformity, discharge,  or lesions. Mouth:  No deformity or lesions.   Neck:  Supple; no masses or thyromegaly. Lungs:  Clear throughout to auscultation.   No wheezes, crackles, or rhonchi.  Heart:  Regular rate and rhythm; no murmurs, clicks, rubs,  or gallops. Abdomen:  Soft,nontender, BS active,nonpalp mass or hsm.   Rectal: Not done Msk:  Symmetrical without gross deformities. . Pulses:  Normal pulses noted. Extremities:  Without clubbing or edema. Neurologic:  Alert and  oriented x4;  grossly normal neurologically. Skin:  Intact without significant lesions or rashes.. Psych:  Alert and cooperative. Normal mood and affect.  Lab Results: Recent Labs    03/27/24 1400 03/28/24 0521  WBC 14.3* 9.8  HGB 10.4* 10.1*  HCT 33.4* 32.1*  PLT 467* 433*   BMET Recent Labs    03/27/24 1400 03/28/24 0521  NA 137 138  K 3.2* 3.4*  CL 100 101  CO2 26 26  GLUCOSE 117* 109*  BUN 12 7  CREATININE 0.45 0.66  CALCIUM 9.7 9.0   LFT Recent Labs    03/28/24 0521  PROT 6.6  ALBUMIN 3.4*  AST 485*  ALT 681*  ALKPHOS 132*  BILITOT 1.1   PT/INR Recent Labs    03/27/24 1610  LABPROT 13.8  INR 1.0     IMPRESSION:  #72 43 year old non-English-speaking Hispanic female transferred from [facility] emergency room last night after she had presented there with acute abdominal pain epigastric with radiation to the back similar to previous gallbladder symptoms.  Workup with CT, then MRI/MRCP confirming an impacted common bile duct stone at the level of the head of the pancreas with intrahepatic ductal dilation.  Also evidence of hepatic steatosis and a small indeterminate lesion 1.7 x 1.1 cm hypointense in segment 3 of the liver with follow-up  MRI recommended in 3 months.  #2 status post cholecystectomy January 2024 for severe chronic cholecystitis #3 hypothyroidism  PLAN: Okay for diet as tolerates today, n.p.o. past midnight Stop subcu heparin There is no availability for anesthesia to add on for ERCP today.  Will plan for ERCP with Dr. Avram for tomorrow 03/29/2024. I discussed the indication for ERCP and stone extraction, we reviewed indications risks and benefits of ERCP including anesthesia risks, perforation, pancreatitis, bleeding, and procedure failure.  I also discussed possibility of stent placement as she has a fairly large stone if stone is unable to be extracted with initial ERCP.  She agrees to proceed.  All questions were answered.    Mackenzie Douglas  03/28/2024, 11:33 AM    Mackenzie Douglas   I have taken an interval history, reviewed the chart and examined the patient. I agree with the Advanced Practitioner's note, impression and recommendations as written.  The majority of the medical decision making was performed by me.  I have personally reviewed the CT and MR images.  Here are my additions to the note:   As above plan for ERCP and stone extraction tomorrow.  I also reviewed the risks benefits and indications of the procedure and that with an impacted stone stent placement could be needed.  No obvious cause of pneumobilia at this time, question if she could have developed a choledochoduodenal fistula.  This is high complexity medical decision making.  Mackenzie CHARLENA Avram, MD, [facility] See TRACEY on call - gastroenterology for best contact person 03/28/2024 1:30 PM

## 2024-03-28 NOTE — Plan of Care (Signed)
   Problem: Elimination: Goal: Will not experience complications related to bowel motility Outcome: Progressing

## 2024-03-28 NOTE — Consult Note (Addendum)
 Consultation  Referring Provider: Levander Slate, MD Primary Care Physician:  Cecily Katz, PA-C Primary Gastroenterologist:  [place]  Reason for Consultation: Acute abdominal pain/nausea and vomiting/choledocholithiasis  HPI: Mackenzie Douglas is a 43 y.o. non-English-speaking Spanish   female who had presented to [facility] emergency room with complaints of acute abdominal pain yesterday associated with nausea and vomiting.  Interview was done today with the assistance of the mobile video interpreter.  She had described the pain as being similar to her previous gallbladder symptoms.  She underwent extensive workup through the emergency room including CT of the abdomen and pelvis which showed mild intrahepatic ductal dilation, and pneumobilia, gallbladder absent, CBD 12 mm with choledocholithiasis present, unremarkable pancreas.  Noted a 9 cm heterogeneous mass in the uterus probable fibroid.  MRI/MRCP shows hepatic steatosis, there is an indeterminant lesion 1.7 x 1.1 cm hypointense in hepatic segment 3, MRI in 3 months suggested.  There is an impacted common bile duct stone at the level of the pancreatic head measuring 1.5 cm and moderate intrahepatic bile duct dilation with possible enhancement.  There is no ERCP/biliary backup at [facility] this weekend so Dr. Enid was contacted last night and excepted patient for transfer here.  Labs today WBC 9.8/Hb 10.1/hematocrit 32.1/MCV 80 Magnesium 1.6 Potassium 3.4/BUN 7/creatinine 0.66 T. bili 1.1/alk phos 132/AST 45/ALT 681 Iron studies within normal limits   Patient does have history of previous acute severe chronic cholecystitis for which she underwent laparoscopic cholecystectomy in January 2024 at [facility].  No IOC was done at that time but there was noted spilling of gallstones at the time of the procedure.  Patient says she feels better today and is actually not having any pain at present she would like to try something to eat,  no nausea or vomiting.  She remains afebrile.   Past Medical History:  Diagnosis Date   Hypothyroid     Past Surgical History:  Procedure Laterality Date   CHOLECYSTECTOMY      Prior to Admission medications   Medication Sig Start Date End Date Taking? Authorizing Provider  EUTHYROX 125 MCG tablet Take 125 mcg by mouth daily. 07/03/19   [provider]  guaiFENesin -codeine  100-10 MG/5ML syrup Take 5 mLs by mouth every 6 (six) hours as needed for cough. 09/25/19   Dorothyann Drivers, MD  ondansetron  (ZOFRAN ) 4 MG tablet Take 1 tablet (4 mg total) by mouth daily as needed for nausea or vomiting. 09/25/19   Dorothyann Drivers, MD  predniSONE  (DELTASONE ) 10 MG tablet Take 1 tablet (10 mg total) by mouth daily. Day 1-3: take 4 tablets PO daily Day 4-6: take 3 tablets PO daily Day 7-9: take 2 tablets PO daily Day 10-12: take 1 tablet PO daily 09/25/19   Dorothyann Drivers, MD    Current Facility-Administered Medications  Medication Dose Route Frequency Provider Last Rate Last Admin   0.9 %  sodium chloride infusion   Intravenous Continuous Tobie Mario GAILS, MD 100 mL/hr at 03/28/24 0615 New Bag at 03/28/24 0615   acetaminophen  (TYLENOL ) tablet 650 mg  650 mg Oral Q6H PRN Patel, Ekta V, MD       Or   acetaminophen  (TYLENOL ) suppository 650 mg  650 mg Rectal Q6H PRN Patel, Ekta V, MD       heparin injection 5,000 Units  5,000 Units Subcutaneous Q8H Patel, Ekta V, MD   5,000 Units at 03/28/24 0621   HYDROcodone-acetaminophen  (NORCO/VICODIN) 5-325 MG per tablet 1 tablet  1 tablet Oral Q4H  PRN Patel, Ekta V, MD       morphine (PF) 2 MG/ML injection 2 mg  2 mg Intravenous Q4H PRN Patel, Ekta V, MD       pantoprazole (PROTONIX) injection 40 mg  40 mg Intravenous Q12H Patel, Ekta V, MD   40 mg at 03/28/24 9379   potassium chloride SA (KLOR-CON M) CR tablet 40 mEq  40 mEq Oral Once Krishnan, Gokul, MD       sodium chloride flush (NS) 0.9 % injection 3 mL  3 mL Intravenous Q12H Tobie Mario GAILS, MD         Allergies as of 03/28/2024   (No Known Allergies)    No family history on file.  Social History   Socioeconomic History   Marital status: Married    Spouse name: Not on file   Number of children: Not on file   Years of education: Not on file   Highest education level: Not on file  Occupational History   Not on file  Tobacco Use   Smoking status: Never   Smokeless tobacco: Never  Vaping Use   Vaping status: Never Used  Substance and Sexual Activity   Alcohol use: Not Currently    Comment: occasionally   Drug use: Never   Sexual activity: Not on file  Other Topics Concern   Not on file  Social History Narrative   Not on file   Social Drivers of Health   Financial Resource Strain: Not on file  Food Insecurity: Food Insecurity Present (03/28/2024)   Hunger Vital Sign    Worried About Running Out of Food in the Last Year: Sometimes true    Ran Out of Food in the Last Year: Sometimes true  Transportation Needs: No Transportation Needs (03/28/2024)   PRAPARE - Administrator, Civil Service (Medical): No    Lack of Transportation (Non-Medical): No  Physical Activity: Not on file  Stress: Not on file  Social Connections: Not on file  Intimate Partner Violence: Not At Risk (03/28/2024)   Humiliation, Afraid, Rape, and Kick questionnaire    Fear of Current or Ex-Partner: No    Emotionally Abused: No    Physically Abused: No    Sexually Abused: No    Review of Systems: Pertinent positive and negative review of systems were noted in the above HPI section.  All other review of systems was otherwise negative.   Physical Exam: Vital signs in last 24 hours: Temp:  [98.3 F (36.8 C)-98.9 F (37.2 C)] 98.5 F (36.9 C) (11/08 0757) Pulse Rate:  [61-110] 76 (11/08 0757) Resp:  [16-20] 17 (11/08 0757) BP: (91-118)/(50-87) 96/51 (11/08 0757) SpO2:  [97 %-100 %] 98 % (11/08 0757) Weight:  [62.6 kg-63.1 kg] 63.1 kg (11/08 0500) Last BM Date :  03/27/24 General:   Alert,  Well-developed, well-nourished, Hispanic female pleasant and cooperative in NAD Head:  Normocephalic and atraumatic. Eyes:  Sclera clear, no icterus.   Conjunctiva pink. Ears:  Normal auditory acuity. Nose:  No deformity, discharge,  or lesions. Mouth:  No deformity or lesions.   Neck:  Supple; no masses or thyromegaly. Lungs:  Clear throughout to auscultation.   No wheezes, crackles, or rhonchi.  Heart:  Regular rate and rhythm; no murmurs, clicks, rubs,  or gallops. Abdomen:  Soft,nontender, BS active,nonpalp mass or hsm.   Rectal: Not done Msk:  Symmetrical without gross deformities. . Pulses:  Normal pulses noted. Extremities:  Without clubbing or edema. Neurologic:  Alert and  oriented x4;  grossly normal neurologically. Skin:  Intact without significant lesions or rashes.. Psych:  Alert and cooperative. Normal mood and affect.  Lab Results: Recent Labs    03/27/24 1400 03/28/24 0521  WBC 14.3* 9.8  HGB 10.4* 10.1*  HCT 33.4* 32.1*  PLT 467* 433*   BMET Recent Labs    03/27/24 1400 03/28/24 0521  NA 137 138  K 3.2* 3.4*  CL 100 101  CO2 26 26  GLUCOSE 117* 109*  BUN 12 7  CREATININE 0.45 0.66  CALCIUM 9.7 9.0   LFT Recent Labs    03/28/24 0521  PROT 6.6  ALBUMIN 3.4*  AST 485*  ALT 681*  ALKPHOS 132*  BILITOT 1.1   PT/INR Recent Labs    03/27/24 1610  LABPROT 13.8  INR 1.0     IMPRESSION:  #72 43 year old non-English-speaking Hispanic female transferred from [facility] emergency room last night after she had presented there with acute abdominal pain epigastric with radiation to the back similar to previous gallbladder symptoms.  Workup with CT, then MRI/MRCP confirming an impacted common bile duct stone at the level of the head of the pancreas with intrahepatic ductal dilation.  Also evidence of hepatic steatosis and a small indeterminate lesion 1.7 x 1.1 cm hypointense in segment 3 of the liver with follow-up  MRI recommended in 3 months.  #2 status post cholecystectomy January 2024 for severe chronic cholecystitis #3 hypothyroidism  PLAN: Okay for diet as tolerates today, n.p.o. past midnight Stop subcu heparin There is no availability for anesthesia to add on for ERCP today.  Will plan for ERCP with Dr. Avram for tomorrow 03/29/2024. I discussed the indication for ERCP and stone extraction, we reviewed indications risks and benefits of ERCP including anesthesia risks, perforation, pancreatitis, bleeding, and procedure failure.  I also discussed possibility of stent placement as she has a fairly large stone if stone is unable to be extracted with initial ERCP.  She agrees to proceed.  All questions were answered.    Amy EsterwoodPA-C  03/28/2024, 11:33 AM    [facility] Attending   I have taken an interval history, reviewed the chart and examined the patient. I agree with the Advanced Practitioner's note, impression and recommendations as written.  The majority of the medical decision making was performed by me.  I have personally reviewed the CT and MR images.  Here are my additions to the note:   As above plan for ERCP and stone extraction tomorrow.  I also reviewed the risks benefits and indications of the procedure and that with an impacted stone stent placement could be needed.  No obvious cause of pneumobilia at this time, question if she could have developed a choledochoduodenal fistula.  This is high complexity medical decision making.  Lupita CHARLENA Avram, MD, [facility] See TRACEY on call - gastroenterology for best contact person 03/28/2024 1:30 PM

## 2024-03-28 NOTE — Progress Notes (Signed)
 [facility] PROGRESS NOTE   Mackenzie Douglas FMW:[id number] DOB: 11-17-1980 DOA: 03/28/2024  PCP: Cecily Katz, PA-C  Brief History: 43 y.o. female with past medical history  of hypothyroidism, cholecystectomy, obesity, presented to [facility] with complaints of abdominal pain.  Was found to have choledocholithiasis.  She was sent over to [facility] for further workup.     Consultants: Gastroenterology  Procedures: None yet    Subjective/Interval History: Patient denies any abdominal pain.  Complains of mild headache.  Last menstrual period was 2 weeks ago.  She was told about the finding of fibroid on the CT scan and the need for follow-up with gynecology.    Assessment/Plan:  Choledocholithiasis Patient presented with abdominal pain.  CT scan raise concern for choledocholithiasis.  Patient underwent MRCP which confirmed choledocholithiasis. Gastroenterology has been consulted. LFTs are noted to be abnormal with elevated alkaline phosphatase AST and ALT.  Bilirubin is normal.  WBC is normal.  Hepatic lesion Indeterminate lesion identified on MRI.  Follow-up imaging in 3 months suggested.  Mass in posterior uterus A 9 cm lesion identified most likely a fibroid.  Patient mentioned that her LMP was 2 weeks ago.  Urine pregnancy test is negative.  Will proceed with pelvic ultrasound. Will need GYN input eventually.  Right renal cyst Simple cyst identified incidentally on CT scan.  Normocytic anemia No evidence of overt bleeding.  Check anemia panel.  Hypokalemia Will be supplemented.  Check magnesium level.   DVT Prophylaxis: Subcutaneous heparin Code Status: Full code Family Communication: Discussed with patient Disposition Plan: Hopefully return home when improved  Status is: Inpatient Remains inpatient appropriate because: Choledocholithiasis      Medications: Scheduled:  heparin  5,000 Units Subcutaneous Q8H   pantoprazole  (PROTONIX) IV  40 mg Intravenous Q12H   potassium chloride  40 mEq Oral Once   sodium chloride flush  3 mL Intravenous Q12H   Continuous:  sodium chloride 100 mL/hr at 03/28/24 0615   PRN:acetaminophen  **OR** acetaminophen , HYDROcodone-acetaminophen , morphine injection  Antibiotics: Anti-infectives (From admission, onward)    None       Objective:  Vital Signs  Vitals:   03/28/24 0400 03/28/24 0426 03/28/24 0500  BP: 106/69    Pulse: 80    Resp: 18    Temp: 98.3 F (36.8 C)    TempSrc: Oral    SpO2: 100% 100%   Weight:   63.1 kg   No intake or output data in the 24 hours ending 03/28/24 0947 Filed Weights   03/28/24 0500  Weight: 63.1 kg    General appearance: Awake alert.  In no distress Resp: Clear to auscultation bilaterally.  Normal effort Cardio: S1-S2 is normal regular.  No S3-S4.  No rubs murmurs or bruit GI: Abdomen is soft.  Nontender nondistended.  Bowel sounds are present normal.  No masses organomegaly Extremities: No edema.  Full range of motion of lower extremities. Neurologic: Alert and oriented x3.  No focal neurological deficits.    Lab Results:  Data Reviewed: I have personally reviewed following labs and reports of the imaging studies  CBC: Recent Labs  Lab 03/27/24 1400 03/28/24 0521  WBC 14.3* 9.8  NEUTROABS  --  8.2*  HGB 10.4* 10.1*  HCT 33.4* 32.1*  MCV 81.1 80.0  PLT 467* 433*    Basic Metabolic Panel: Recent Labs  Lab 03/27/24 1400 03/28/24 0521  NA 137 138  K 3.2* 3.4*  CL 100 101  CO2 26 26  GLUCOSE  117* 109*  BUN 12 7  CREATININE 0.45 0.66  CALCIUM 9.7 9.0    GFR: Estimated Creatinine Clearance: 80.8 mL/min (by C-G formula based on SCr of 0.66 mg/dL).  Liver Function Tests: Recent Labs  Lab 03/27/24 1400 03/28/24 0521  AST 388* 485*  ALT 230* 681*  ALKPHOS 129* 132*  BILITOT 1.2 1.1  PROT 7.9 6.6  ALBUMIN 4.3 3.4*    Recent Labs  Lab 03/27/24 1400  LIPASE 31   Coagulation Profile: Recent  Labs  Lab 03/27/24 1610  INR 1.0    Anemia Panel: Recent Labs    03/28/24 0808  RETICCTPCT 0.8    Radiology Studies: MR ABDOMEN MRCP W WO CONTAST Result Date: 03/28/2024 EXAM: MRCP WITH IV CONTRAST 03/27/2024 09:24:11 PM TECHNIQUE: Multisequence, multiplanar magnetic resonance images of the abdomen without and with intravenous contrast. MRCP sequences were performed. Exam detail diminished by motion artifact. COMPARISON: Comparison 03/27/2024 at 5:23 pm. CLINICAL HISTORY: 175970 Biliary obstruction (HCC) 824029; suspected biliary obstruction. FINDINGS: LIVER: Hepatic steatosis. T2 hyperintense T1 isointense to hypointense structure within segment 3 without restricted diffusion is identified measuring 1.7 x 1.1 cm, image 20/8. This is indeterminate but does not show definitive enhancement on the postcontrast images. GALLBLADDER AND BILIARY SYSTEM: Gallbladder is surgically absent. There is an impacted stone within the common bile duct at the level of the head of pancreas as noted on the CT measuring 1.5 cm, image 39/20. Moderate intrahepatic bile duct dilatation with possible enhancement along the biliary radicles. SPLEEN: The spleen is within normal limits in size and appearance. PANCREAS/PANCREATIC DUCT: The pancreas is normal in size and contour, without focal lesion or ductal dilatation. ADRENAL GLANDS: Normal size and morphology bilaterally. No nodule, thickening, or hemorrhage. No periadrenal stranding. KIDNEYS: Bosniak class cysts within the medial cortex of the interpolar right kidney measure 0.8 cm, image 25/8. No follow-up imaging recommended. LYMPH NODES: No enlarged abdominal lymph nodes. VASULATURE: Unremarkable. PERITONEUM: Small volume of perihepatic fluid identified. No focal fluid collections. ABDOMINAL WALL: No hernia. No mass. BOWEL: Grossly unremarkable. No bowel obstruction. BONES: No acute abnormality or worrisome osseous lesion. SOFT TISSUES: Unremarkable. MISCELLANEOUS: Fibroid  uterus is again noted. IMPRESSION: 1. Choledocholithiasis with an impacted 1.5 cm common bile duct stone at the pancreatic head and associated moderate intrahepatic bile duct dilatation, with possible biliary radicle enhancement. 2. Indeterminate 1.7 x 1.1 cm T2 hyperintense, T1 iso- to hypointense lesion in hepatic segment 3 without restricted diffusion or definitive postcontrast enhancement. Suggest follow-up imaging in 3 months with repeat MRI with contrast material to assess for stability. Electronically signed by: Waddell Calk MD 03/28/2024 05:15 AM EST RP Workstation: [id number]   MR 3D Recon At Scanner Result Date: 03/28/2024 EXAM: MRCP WITH IV CONTRAST 03/27/2024 09:24:11 PM TECHNIQUE: Multisequence, multiplanar magnetic resonance images of the abdomen without and with intravenous contrast. MRCP sequences were performed. Exam detail diminished by motion artifact. COMPARISON: Comparison 03/27/2024 at 5:23 pm. CLINICAL HISTORY: 175970 Biliary obstruction (HCC) 824029; suspected biliary obstruction. FINDINGS: LIVER: Hepatic steatosis. T2 hyperintense T1 isointense to hypointense structure within segment 3 without restricted diffusion is identified measuring 1.7 x 1.1 cm, image 20/8. This is indeterminate but does not show definitive enhancement on the postcontrast images. GALLBLADDER AND BILIARY SYSTEM: Gallbladder is surgically absent. There is an impacted stone within the common bile duct at the level of the head of pancreas as noted on the CT measuring 1.5 cm, image 39/20. Moderate intrahepatic bile duct dilatation with possible enhancement along the biliary radicles. SPLEEN: The  spleen is within normal limits in size and appearance. PANCREAS/PANCREATIC DUCT: The pancreas is normal in size and contour, without focal lesion or ductal dilatation. ADRENAL GLANDS: Normal size and morphology bilaterally. No nodule, thickening, or hemorrhage. No periadrenal stranding. KIDNEYS: Bosniak class cysts within the  medial cortex of the interpolar right kidney measure 0.8 cm, image 25/8. No follow-up imaging recommended. LYMPH NODES: No enlarged abdominal lymph nodes. VASULATURE: Unremarkable. PERITONEUM: Small volume of perihepatic fluid identified. No focal fluid collections. ABDOMINAL WALL: No hernia. No mass. BOWEL: Grossly unremarkable. No bowel obstruction. BONES: No acute abnormality or worrisome osseous lesion. SOFT TISSUES: Unremarkable. MISCELLANEOUS: Fibroid uterus is again noted. IMPRESSION: 1. Choledocholithiasis with an impacted 1.5 cm common bile duct stone at the pancreatic head and associated moderate intrahepatic bile duct dilatation, with possible biliary radicle enhancement. 2. Indeterminate 1.7 x 1.1 cm T2 hyperintense, T1 iso- to hypointense lesion in hepatic segment 3 without restricted diffusion or definitive postcontrast enhancement. Suggest follow-up imaging in 3 months with repeat MRI with contrast material to assess for stability. Electronically signed by: Waddell Calk MD 03/28/2024 05:15 AM EST RP Workstation: [id number]   CT ABDOMEN PELVIS W CONTRAST Result Date: 03/27/2024 EXAM: CT ABDOMEN AND PELVIS WITH CONTRAST 03/27/2024 05:35:42 PM TECHNIQUE: CT of the abdomen and pelvis was performed with the administration of 100 mL of iohexol (OMNIPAQUE) 300 MG/ML solution. Multiplanar reformatted images are provided for review. Automated exposure control, iterative reconstruction, and/or weight-based adjustment of the mA/kV was utilized to reduce the radiation dose to as low as reasonably achievable. COMPARISON: None available. CLINICAL HISTORY: Abdominal pain, acute, nonlocalized. FINDINGS: LOWER CHEST: No acute abnormality. LIVER: Mild intrahepatic biliary ductal dilatation and pneumobilia. GALLBLADDER AND BILE DUCTS: Gallbladder is surgically absent. Choledocholithiasis is present. The common bile duct is dilated measuring up to 12 mm. SPLEEN: No acute abnormality. PANCREAS: No acute abnormality.  ADRENAL GLANDS: No acute abnormality. KIDNEYS, URETERS AND BLADDER: There is a 9 mm right renal cyst. Per consensus, no follow-up is needed for simple Bosniak type 1 and 2 renal cysts, unless the patient has a malignancy history or risk factors. No stones in the kidneys or ureters. No hydronephrosis. No perinephric or periureteral stranding. Urinary bladder is unremarkable. GI AND BOWEL: Stomach demonstrates no acute abnormality. Appendix is normal. There is no bowel obstruction. PERITONEUM AND RETROPERITONEUM: No ascites. No free air. VASCULATURE: Aorta is normal in caliber. LYMPH NODES: No lymphadenopathy. REPRODUCTIVE ORGANS: There is a small amount of fluid in the endometrial canal. There is a 9 cm heterogeneous mass in posterior uterus, likely a fibroid. There is some central areas of hyperdensity and internal hemorrhage cannot be excluded within this fibroid. Adnexa are within normal limits. BONES AND SOFT TISSUES: No acute osseous abnormality. No focal soft tissue abnormality. IMPRESSION: 1. Choledocholithiasis with dilated common bile duct (up to 12 mm), mild intrahepatic biliary ductal dilatation, and pneumobilia  recommend correlation with prior imaging and clinical evaluation for possible biliary obstruction; consider hepatobiliary ultrasound or MRCP and gastroenterology/hepatobiliary consultation as clinically indicated. 2. 9 cm heterogeneous mass in posterior uterus, likely a fibroid, with possible internal hemorrhage  recommend gynecologic correlation; consider pelvic ultrasound or MRI with contrast for further characterization and management. 3. 9 mm right renal cyst  simple-appearing; no follow-up recommended. 4. Small amount of fluid in the endometrial canal  correlate with menstrual history; gynecologic follow-up as indicated. Electronically signed by: Greig Pique MD 03/27/2024 06:12 PM EST RP Workstation: [id number]       LOS: 0 days  Kellen Hover  [facility] Pager on  [email or web address]  03/28/2024, 9:47 AM

## 2024-03-28 NOTE — Anesthesia Preprocedure Evaluation (Signed)
 Anesthesia Evaluation  Patient identified by MRN, date of birth, ID band Patient awake    Reviewed: Allergy & Precautions, NPO status , Patient's Chart, lab work & pertinent test results  Airway Mallampati: II  TM Distance: >3 FB Neck ROM: Full    Dental  (+) Dental Advisory Given, Teeth Intact   Pulmonary neg pulmonary ROS   Pulmonary exam normal breath sounds clear to auscultation       Cardiovascular negative cardio ROS Normal cardiovascular exam Rhythm:Regular Rate:Normal     Neuro/Psych negative neurological ROS     GI/Hepatic Neg liver ROS,GERD  ,,  Endo/Other  Hypothyroidism    Renal/GU negative Renal ROS     Musculoskeletal negative musculoskeletal ROS (+)    Abdominal   Peds  Hematology negative hematology ROS (+)   Anesthesia Other Findings   Reproductive/Obstetrics negative OB ROS                              Anesthesia Physical Anesthesia Plan  ASA: 2  Anesthesia Plan: General   Post-op Pain Management: Minimal or no pain anticipated   Induction: Intravenous  PONV Risk Score and Plan: Ondansetron , Dexamethasone, Treatment may vary due to age or medical condition and Midazolam  Airway Management Planned: Oral ETT  Additional Equipment:   Intra-op Plan:   Post-operative Plan: Extubation in OR  Informed Consent: I have reviewed the patients History and Physical, chart, labs and discussed the procedure including the risks, benefits and alternatives for the proposed anesthesia with the patient or authorized representative who has indicated his/her understanding and acceptance.     Dental advisory given  Plan Discussed with: CRNA  Anesthesia Plan Comments:          Anesthesia Quick Evaluation

## 2024-03-28 NOTE — H&P (Addendum)
 History and Physical    Patient: Mackenzie Douglas DOB: 1981/01/28 DOA: 03/28/2024 DOS: the patient was seen and examined on 03/28/2024 . PCP: Cecily Katz, PA-C  Patient coming from: [facility] Chief complaint: Abdominal pain Nausea vomiting  HPI:  Mackenzie Douglas is a 43 y.o. female with past medical history  of hypothyroidism, cholecystectomy, obesity with a BMI of 25.24, coming from [facility] regional for abdominal pain radiating to the back for the past 24 hours along with nausea and vomiting patient reports sharp pain similar to when she had  with gallstones before. GI Dr. Enid has accepted patient and will see as consult in AM.   ED Course: Vital signs in the ED were notable for the following:  Heart rate of 100 respiration of 20 temperature of 98.3 O2 sats 100% weight of 138 pounds. >>ED evaluation thus far shows: CMP showing abnormal LFTs with AST of 388 ALT of 230 alk phos 129.  Neck 9 abnormal CBC with leukocytosis of 14.3 hemoglobin of 10.4 platelets 467. CT imaging shows abnormal findings of choledocholithiasis dilated common bile duct at 12 mm. Choledocholithiasis with dilated common bile duct (up to 12 mm), mild intrahepatic biliary ductal dilatation, and pneumobilia  recommend correlation with prior imaging and clinical evaluation for possible biliary obstruction; consider hepatobiliary ultrasound or MRCP and gastroenterology/hepatobiliary consultation as clinically indicated. 2. 9 cm heterogeneous mass in posterior uterus, likely a fibroid, with possible internal hemorrhage  recommend gynecologic correlation; consider pelvic ultrasound or MRI with contrast for further characterization and management. 3. 9 mm right renal cyst  simple-appearing; no follow-up recommended. 4. Small amount of fluid in the endometrial canal  correlate with menstrual history; gynecologic follow-up as indicated.    >>While in the ED patient received the following: Zosyn,  Zofran , morphine, Phenergan, Tylenol .   Review of Systems  Gastrointestinal:  Positive for abdominal pain, nausea and vomiting.   Past Medical History:  Diagnosis Date   Hypothyroid    Past Surgical History:  Procedure Laterality Date   CHOLECYSTECTOMY      reports that she has never smoked. She has never used smokeless tobacco. She reports that she does not currently use alcohol. She reports that she does not use drugs. No Known Allergies No family history on file. Prior to Admission medications   Medication Sig Start Date End Date Taking? Authorizing Provider  EUTHYROX 125 MCG tablet Take 125 mcg by mouth daily. 07/03/19   [provider]  guaiFENesin -codeine  100-10 MG/5ML syrup Take 5 mLs by mouth every 6 (six) hours as needed for cough. 09/25/19   Dorothyann Drivers, MD  ondansetron  (ZOFRAN ) 4 MG tablet Take 1 tablet (4 mg total) by mouth daily as needed for nausea or vomiting. 09/25/19   Dorothyann Drivers, MD  predniSONE  (DELTASONE ) 10 MG tablet Take 1 tablet (10 mg total) by mouth daily. Day 1-3: take 4 tablets PO daily Day 4-6: take 3 tablets PO daily Day 7-9: take 2 tablets PO daily Day 10-12: take 1 tablet PO daily 09/25/19   Dorothyann Drivers, MD  Vitals:   03/28/24 0400 03/28/24 0426  BP: 106/69   Pulse: 80   Resp: 18   Temp: 98.3 F (36.8 C)   TempSrc: Oral   SpO2: 100% 100%   Physical Exam Vitals reviewed.  Constitutional:      General: She is not in acute distress.    Appearance: She is not ill-appearing.  HENT:     Head: Normocephalic.  Eyes:     Extraocular Movements: Extraocular movements intact.  Cardiovascular:     Rate and Rhythm: Normal rate and regular rhythm.     Heart sounds: Normal heart sounds.  Pulmonary:     Breath sounds: Normal breath sounds.  Abdominal:     General: There is no distension.     Palpations: Abdomen is soft.     Tenderness: There is  abdominal tenderness in the epigastric area.  Musculoskeletal:     Right lower leg: No edema.     Left lower leg: No edema.  Neurological:     General: No focal deficit present.     Mental Status: She is alert and oriented to person, place, and time.     Labs on Admission: I have personally reviewed following labs and imaging studies CBC: Recent Labs  Lab 03/27/24 1400  WBC 14.3*  HGB 10.4*  HCT 33.4*  MCV 81.1  PLT 467*   Basic Metabolic Panel: Recent Labs  Lab 03/27/24 1400  NA 137  K 3.2*  CL 100  CO2 26  GLUCOSE 117*  BUN 12  CREATININE 0.45  CALCIUM 9.7   GFR: Estimated Creatinine Clearance: 80.5 mL/min (by C-G formula based on SCr of 0.45 mg/dL). Liver Function Tests: Recent Labs  Lab 03/27/24 1400  AST 388*  ALT 230*  ALKPHOS 129*  BILITOT 1.2  PROT 7.9  ALBUMIN 4.3   Recent Labs  Lab 03/27/24 1400  LIPASE 31   No results for input(s): AMMONIA in the last 168 hours. Recent Labs    03/27/24 1400  BUN 12  CREATININE 0.45    Cardiac Enzymes: No results for input(s): CKTOTAL, CKMB, CKMBINDEX, TROPONINI in the last 168 hours. BNP (last 3 results) No results for input(s): PROBNP in the last 8760 hours. HbA1C: No results for input(s): HGBA1C in the last 72 hours. CBG: No results for input(s): GLUCAP in the last 168 hours. Lipid Profile: No results for input(s): CHOL, HDL, LDLCALC, TRIG, CHOLHDL, LDLDIRECT in the last 72 hours. Thyroid Function Tests: No results for input(s): TSH, T4TOTAL, FREET4, T3FREE, THYROIDAB in the last 72 hours. Anemia Panel: No results for input(s): VITAMINB12, FOLATE, FERRITIN, TIBC, IRON, RETICCTPCT in the last 72 hours. Urine analysis:    Component Value Date/Time   COLORURINE YELLOW (A) 03/27/2024 1402   APPEARANCEUR CLOUDY (A) 03/27/2024 1402   LABSPEC 1.014 03/27/2024 1402   PHURINE 7.0 03/27/2024 1402   GLUCOSEU NEGATIVE 03/27/2024 1402   HGBUR NEGATIVE  03/27/2024 1402   BILIRUBINUR NEGATIVE 03/27/2024 1402   KETONESUR NEGATIVE 03/27/2024 1402   PROTEINUR NEGATIVE 03/27/2024 1402   NITRITE NEGATIVE 03/27/2024 1402   LEUKOCYTESUR NEGATIVE 03/27/2024 1402   Radiological Exams on Admission: MR ABDOMEN MRCP W WO CONTAST Result Date: 03/28/2024 EXAM: MRCP WITH IV CONTRAST 03/27/2024 09:24:11 PM TECHNIQUE: Multisequence, multiplanar magnetic resonance images of the abdomen without and with intravenous contrast. MRCP sequences were performed. Exam detail diminished by motion artifact. COMPARISON: Comparison 03/27/2024 at 5:23 pm. CLINICAL HISTORY: 175970 Biliary obstruction (HCC) 824029; suspected biliary obstruction. FINDINGS: LIVER: Hepatic steatosis. T2 hyperintense T1  isointense to hypointense structure within segment 3 without restricted diffusion is identified measuring 1.7 x 1.1 cm, image 20/8. This is indeterminate but does not show definitive enhancement on the postcontrast images. GALLBLADDER AND BILIARY SYSTEM: Gallbladder is surgically absent. There is an impacted stone within the common bile duct at the level of the head of pancreas as noted on the CT measuring 1.5 cm, image 39/20. Moderate intrahepatic bile duct dilatation with possible enhancement along the biliary radicles. SPLEEN: The spleen is within normal limits in size and appearance. PANCREAS/PANCREATIC DUCT: The pancreas is normal in size and contour, without focal lesion or ductal dilatation. ADRENAL GLANDS: Normal size and morphology bilaterally. No nodule, thickening, or hemorrhage. No periadrenal stranding. KIDNEYS: Bosniak class cysts within the medial cortex of the interpolar right kidney measure 0.8 cm, image 25/8. No follow-up imaging recommended. LYMPH NODES: No enlarged abdominal lymph nodes. VASULATURE: Unremarkable. PERITONEUM: Small volume of perihepatic fluid identified. No focal fluid collections. ABDOMINAL WALL: No hernia. No mass. BOWEL: Grossly unremarkable. No bowel  obstruction. BONES: No acute abnormality or worrisome osseous lesion. SOFT TISSUES: Unremarkable. MISCELLANEOUS: Fibroid uterus is again noted. IMPRESSION: 1. Choledocholithiasis with an impacted 1.5 cm common bile duct stone at the pancreatic head and associated moderate intrahepatic bile duct dilatation, with possible biliary radicle enhancement. 2. Indeterminate 1.7 x 1.1 cm T2 hyperintense, T1 iso- to hypointense lesion in hepatic segment 3 without restricted diffusion or definitive postcontrast enhancement. Suggest follow-up imaging in 3 months with repeat MRI with contrast material to assess for stability. Electronically signed by: Waddell Calk MD 03/28/2024 05:15 AM EST RP Workstation: [id number]   MR 3D Recon At Scanner Result Date: 03/28/2024 EXAM: MRCP WITH IV CONTRAST 03/27/2024 09:24:11 PM TECHNIQUE: Multisequence, multiplanar magnetic resonance images of the abdomen without and with intravenous contrast. MRCP sequences were performed. Exam detail diminished by motion artifact. COMPARISON: Comparison 03/27/2024 at 5:23 pm. CLINICAL HISTORY: 175970 Biliary obstruction (HCC) 824029; suspected biliary obstruction. FINDINGS: LIVER: Hepatic steatosis. T2 hyperintense T1 isointense to hypointense structure within segment 3 without restricted diffusion is identified measuring 1.7 x 1.1 cm, image 20/8. This is indeterminate but does not show definitive enhancement on the postcontrast images. GALLBLADDER AND BILIARY SYSTEM: Gallbladder is surgically absent. There is an impacted stone within the common bile duct at the level of the head of pancreas as noted on the CT measuring 1.5 cm, image 39/20. Moderate intrahepatic bile duct dilatation with possible enhancement along the biliary radicles. SPLEEN: The spleen is within normal limits in size and appearance. PANCREAS/PANCREATIC DUCT: The pancreas is normal in size and contour, without focal lesion or ductal dilatation. ADRENAL GLANDS: Normal size and  morphology bilaterally. No nodule, thickening, or hemorrhage. No periadrenal stranding. KIDNEYS: Bosniak class cysts within the medial cortex of the interpolar right kidney measure 0.8 cm, image 25/8. No follow-up imaging recommended. LYMPH NODES: No enlarged abdominal lymph nodes. VASULATURE: Unremarkable. PERITONEUM: Small volume of perihepatic fluid identified. No focal fluid collections. ABDOMINAL WALL: No hernia. No mass. BOWEL: Grossly unremarkable. No bowel obstruction. BONES: No acute abnormality or worrisome osseous lesion. SOFT TISSUES: Unremarkable. MISCELLANEOUS: Fibroid uterus is again noted. IMPRESSION: 1. Choledocholithiasis with an impacted 1.5 cm common bile duct stone at the pancreatic head and associated moderate intrahepatic bile duct dilatation, with possible biliary radicle enhancement. 2. Indeterminate 1.7 x 1.1 cm T2 hyperintense, T1 iso- to hypointense lesion in hepatic segment 3 without restricted diffusion or definitive postcontrast enhancement. Suggest follow-up imaging in 3 months with repeat MRI with contrast material to assess  for stability. Electronically signed by: Waddell Calk MD 03/28/2024 05:15 AM EST RP Workstation: [id number]   CT ABDOMEN PELVIS W CONTRAST Result Date: 03/27/2024 EXAM: CT ABDOMEN AND PELVIS WITH CONTRAST 03/27/2024 05:35:42 PM TECHNIQUE: CT of the abdomen and pelvis was performed with the administration of 100 mL of iohexol (OMNIPAQUE) 300 MG/ML solution. Multiplanar reformatted images are provided for review. Automated exposure control, iterative reconstruction, and/or weight-based adjustment of the mA/kV was utilized to reduce the radiation dose to as low as reasonably achievable. COMPARISON: None available. CLINICAL HISTORY: Abdominal pain, acute, nonlocalized. FINDINGS: LOWER CHEST: No acute abnormality. LIVER: Mild intrahepatic biliary ductal dilatation and pneumobilia. GALLBLADDER AND BILE DUCTS: Gallbladder is surgically absent. Choledocholithiasis is  present. The common bile duct is dilated measuring up to 12 mm. SPLEEN: No acute abnormality. PANCREAS: No acute abnormality. ADRENAL GLANDS: No acute abnormality. KIDNEYS, URETERS AND BLADDER: There is a 9 mm right renal cyst. Per consensus, no follow-up is needed for simple Bosniak type 1 and 2 renal cysts, unless the patient has a malignancy history or risk factors. No stones in the kidneys or ureters. No hydronephrosis. No perinephric or periureteral stranding. Urinary bladder is unremarkable. GI AND BOWEL: Stomach demonstrates no acute abnormality. Appendix is normal. There is no bowel obstruction. PERITONEUM AND RETROPERITONEUM: No ascites. No free air. VASCULATURE: Aorta is normal in caliber. LYMPH NODES: No lymphadenopathy. REPRODUCTIVE ORGANS: There is a small amount of fluid in the endometrial canal. There is a 9 cm heterogeneous mass in posterior uterus, likely a fibroid. There is some central areas of hyperdensity and internal hemorrhage cannot be excluded within this fibroid. Adnexa are within normal limits. BONES AND SOFT TISSUES: No acute osseous abnormality. No focal soft tissue abnormality. IMPRESSION: 1. Choledocholithiasis with dilated common bile duct (up to 12 mm), mild intrahepatic biliary ductal dilatation, and pneumobilia  recommend correlation with prior imaging and clinical evaluation for possible biliary obstruction; consider hepatobiliary ultrasound or MRCP and gastroenterology/hepatobiliary consultation as clinically indicated. 2. 9 cm heterogeneous mass in posterior uterus, likely a fibroid, with possible internal hemorrhage  recommend gynecologic correlation; consider pelvic ultrasound or MRI with contrast for further characterization and management. 3. 9 mm right renal cyst  simple-appearing; no follow-up recommended. 4. Small amount of fluid in the endometrial canal  correlate with menstrual history; gynecologic follow-up as indicated. Electronically signed by: Greig Pique MD  03/27/2024 06:12 PM EST RP Workstation: [id number]   Data Reviewed: Relevant notes from primary care and specialist visits, past discharge summaries as available in EHR, including Care Everywhere . Prior diagnostic testing as pertinent to current admission diagnoses, Updated medications and problem lists for reconciliation .ED course, including vitals, labs, imaging, treatment and response to treatment,Triage notes, nursing and pharmacy notes and ED provider's notes.Notable results as noted in HPI.Discussed case with EDMD/ ED APP/ or Specialty MD on call and as needed.  Assessment & Plan   >> Abdominal pain/Choledocholithiasis: Admit to Med surg. GI consult per am team to let them know pt has arrived.,  Dr. Avram has excepted patient for MRCP and will see patient as consult. Will keep pt npo, IVF and pain control. IV PPI and antiemetics.   >>Abnormal Lft: obstructive pattern with elevated alk phos , normal bilirubin 2/2 to CBD diln 2/2 choledocholithiasis.  GI consulted and notified. Plan for ERCP in am tomorrow clears today as tolerated.    >>Leucocytosis: Could be reactive or infectious, we will get blood cultures/ lactic.  If wbc cont to rise and diff is abnormal on  cbc will probably start abx with gm neg and anaerobic coverage.    >>Anemia: Hb of 10.4 we will type and screen / IV ppi.  Follow trend.    >>Hypokalemia: Replace and follow levels.    >> Hypothyroidism: Will continue patient on her Euthyrox 125 mcg.  >> Obesity with a BMI of 25.24: Will add an A1c.   DVT prophylaxis:  Heparin.  Consults:  GI: please notify about pt arrival to am gi.  Advance Care Planning:    Code Status: Full Code   Family Communication:  None.  Disposition Plan:  Home.   Severity of Illness: The appropriate patient status for this patient is INPATIENT. Inpatient status is judged to be reasonable and necessary in order to provide the required intensity of service to ensure the  patient's safety. The patient's presenting symptoms, physical exam findings, and initial radiographic and laboratory data in the context of their chronic comorbidities is felt to place them at high risk for further clinical deterioration. Furthermore, it is not anticipated that the patient will be medically stable for discharge from the hospital within 2 midnights of admission.   * I certify that at the point of admission it is my clinical judgment that the patient will require inpatient hospital care spanning beyond 2 midnights from the point of admission due to high intensity of service, high risk for further deterioration and high frequency of surveillance required.*  Unresulted Labs (From admission, onward)     Start     Ordered   03/29/24 0500  Occult blood card to lab, stool RN will collect  Daily,   R     Question:  Specimen to be collected by:  Answer:  RN will collect   03/28/24 0543   03/28/24 0554  Lactic acid, plasma  (Lactic Acid)  STAT Now then every 2 hours,   STAT      03/28/24 0553   03/28/24 0544  Vitamin B12  (Anemia Panel (PNL))  Once,   R        03/28/24 0543   03/28/24 0544  Folate  (Anemia Panel (PNL))  Once,   R        03/28/24 0543   03/28/24 0544  Iron and TIBC  (Anemia Panel (PNL))  Once,   R        03/28/24 0543   03/28/24 0544  Ferritin  (Anemia Panel (PNL))  Once,   R        03/28/24 0543   03/28/24 0544  Reticulocytes  (Anemia Panel (PNL))  Once,   R        03/28/24 0543   03/28/24 0544  Type and screen  Once,   R        03/28/24 0543   03/28/24 0544  Blood culture (routine x 2)  BLOOD CULTURE X 2,   R      03/28/24 0543   03/28/24 0500  Comprehensive metabolic panel  Tomorrow morning,   R        03/28/24 0427   03/28/24 0500  CBC  Tomorrow morning,   R        03/28/24 0427   03/28/24 0428  CBC with Differential/Platelet  Once,   R        03/28/24 0427   03/28/24 0426  HIV Antibody (routine testing w rflx)  (HIV Antibody (Routine testing w reflex) panel)   Once,   R        03/28/24 0427  Meds ordered this encounter  Medications   heparin injection 5,000 Units   sodium chloride flush (NS) 0.9 % injection 3 mL   0.9 %  sodium chloride infusion   OR Linked Order Group    acetaminophen  (TYLENOL ) tablet 650 mg    acetaminophen  (TYLENOL ) suppository 650 mg   HYDROcodone-acetaminophen  (NORCO/VICODIN) 5-325 MG per tablet 1 tablet    Refill:  0   morphine (PF) 2 MG/ML injection 2 mg   pantoprazole (PROTONIX) injection 40 mg     Orders Placed This Encounter  Procedures   Blood culture (routine x 2)   HIV Antibody (routine testing w rflx)   Comprehensive metabolic panel   CBC   CBC with Differential/Platelet   Vitamin B12   Folate   Iron and TIBC   Ferritin   Reticulocytes   Occult blood card to lab, stool RN will collect   Lactic acid, plasma   Diet clear liquid Room service appropriate? Yes; Fluid consistency: Thin   Maintain IV access   Vital signs   Notify physician (specify)   Mobility Protocol: No Restrictions   Refer to Sidebar Report Mobility Protocol for Adult Inpatient   Initiate Adult Central Line Maintenance and Catheter Clearance Protocol for patients with central line (CVC, PICC, Port, Hemodialysis, Trialysis)   Daily weights   Intake and Output   Initiate CHG Protocol for patients in ICU/SD or any patient with a central line or foley catheter   Do not place and if present remove PureWick   Initiate Oral Care Protocol   Initiate Carrier Fluid Protocol   RN may order General Admission PRN Orders utilizing General Admission PRN medications (through manage orders) for the following patient needs: allergy symptoms (Claritin), cold sores (Carmex), cough (Robitussin DM), eye irritation (Liquifilm Tears), hemorrhoids (Tucks), indigestion (Maalox), minor skin irritation (Hydrocortisone Cream), muscle pain Lucienne Gay), nose irritation (saline nasal spray) and sore throat (Chloraseptic spray).   Full code   Pulse  oximetry check with vital signs   Oxygen therapy Mode or (Route): Nasal cannula; Liters Per Minute: 2; Keep O2 saturation between: greater than 92 %   Type and screen   Admit to Inpatient (patient's expected length of stay will be greater than 2 midnights or inpatient only procedure)    Author: Mario LULLA Blanch, MD 12 pm- 8 pm. [facility]. 03/28/2024 5:53 AM Please note for any communication after hours contact [facility] Assigned provider on call on Amion.

## 2024-03-29 ENCOUNTER — Inpatient Hospital Stay (HOSPITAL_COMMUNITY): Payer: Self-pay | Admitting: Anesthesiology

## 2024-03-29 ENCOUNTER — Inpatient Hospital Stay (HOSPITAL_COMMUNITY): Payer: Self-pay

## 2024-03-29 ENCOUNTER — Encounter (HOSPITAL_COMMUNITY)
Admission: EM | Disposition: A | Discharge status: Home / Self Care | Payer: Self-pay | Source: Other Acute Inpatient Hospital | Attending: Internal Medicine

## 2024-03-29 ENCOUNTER — Encounter (HOSPITAL_COMMUNITY): Payer: Self-pay | Admitting: Internal Medicine

## 2024-03-29 DIAGNOSIS — K8051 Calculus of bile duct without cholangitis or cholecystitis with obstruction: Secondary | ICD-10-CM

## 2024-03-29 DIAGNOSIS — E876 Hypokalemia: Secondary | ICD-10-CM

## 2024-03-29 DIAGNOSIS — D259 Leiomyoma of uterus, unspecified: Secondary | ICD-10-CM

## 2024-03-29 HISTORY — PX: ERCP: SHX5425

## 2024-03-29 LAB — COMPREHENSIVE METABOLIC PANEL WITH GFR
ALT: 374 U/L — ABNORMAL HIGH (ref 0–44)
AST: 132 U/L — ABNORMAL HIGH (ref 15–41)
Albumin: 3.1 g/dL — ABNORMAL LOW (ref 3.5–5.0)
Alkaline Phosphatase: 128 U/L — ABNORMAL HIGH (ref 38–126)
Anion gap: 12 (ref 5–15)
BUN: 6 mg/dL (ref 6–20)
CO2: 24 mmol/L (ref 22–32)
Calcium: 8.5 mg/dL — ABNORMAL LOW (ref 8.9–10.3)
Chloride: 103 mmol/L (ref 98–111)
Creatinine, Ser: 0.62 mg/dL (ref 0.44–1.00)
GFR, Estimated: >60 mL/min (ref >60)
Glucose, Bld: 105 mg/dL — ABNORMAL HIGH (ref 70–99)
Potassium: 3.4 mmol/L — ABNORMAL LOW (ref 3.5–5.1)
Sodium: 139 mmol/L (ref 135–145)
Total Bilirubin: 0.6 mg/dL (ref 0.0–1.2)
Total Protein: 6.1 g/dL — ABNORMAL LOW (ref 6.5–8.1)

## 2024-03-29 LAB — CBC
HCT: 29.1 % — ABNORMAL LOW (ref 36.0–46.0)
Hemoglobin: 9.3 g/dL — ABNORMAL LOW (ref 12.0–15.0)
MCH: 26.1 pg (ref 26.0–34.0)
MCHC: 32 g/dL (ref 30.0–36.0)
MCV: 81.5 fL (ref 80.0–100.0)
Platelets: 355 K/uL (ref 150–400)
RBC: 3.57 MIL/uL — ABNORMAL LOW (ref 3.87–5.11)
RDW: 15.3 % (ref 11.5–15.5)
WBC: 6.5 K/uL (ref 4.0–10.5)
nRBC: 0 % (ref 0.0–0.2)

## 2024-03-29 LAB — MAGNESIUM: Magnesium: 1.7 mg/dL (ref 1.7–2.4)

## 2024-03-29 SURGERY — ERCP, WITH INTERVENTION IF INDICATED
Anesthesia: General

## 2024-03-29 MED ORDER — PHENYLEPHRINE 80 MCG/ML (10ML) SYRINGE FOR IV PUSH (FOR BLOOD PRESSURE SUPPORT)
PREFILLED_SYRINGE | INTRAVENOUS | Status: DC | PRN
  Administered 2024-03-29 (×2): 80 ug via INTRAVENOUS

## 2024-03-29 MED ORDER — DEXAMETHASONE SOD PHOSPHATE PF 10 MG/ML IJ SOLN
INTRAMUSCULAR | Status: DC | PRN
  Administered 2024-03-29: 10 mg via INTRAVENOUS

## 2024-03-29 MED ORDER — LEVOTHYROXINE SODIUM 137 MCG PO TABS
137.0000 ug | ORAL_TABLET | Freq: Every day | ORAL | Status: DC
  Administered 2024-03-30: 137 ug via ORAL
  Filled 2024-03-29: qty 1

## 2024-03-29 MED ORDER — ONDANSETRON HCL 4 MG/2ML IJ SOLN
INTRAMUSCULAR | Status: DC | PRN
  Administered 2024-03-29: 4 mg via INTRAVENOUS

## 2024-03-29 MED ORDER — SUGAMMADEX SODIUM 200 MG/2ML IV SOLN
INTRAVENOUS | Status: DC | PRN
  Administered 2024-03-29 (×2): 200 mg via INTRAVENOUS

## 2024-03-29 MED ORDER — FENTANYL CITRATE (PF) 250 MCG/5ML IJ SOLN
INTRAMUSCULAR | Status: DC | PRN
  Administered 2024-03-29 (×2): 50 ug via INTRAVENOUS

## 2024-03-29 MED ORDER — MIDAZOLAM HCL (PF) 2 MG/2ML IJ SOLN
INTRAMUSCULAR | Status: DC | PRN
  Administered 2024-03-29: 2 mg via INTRAVENOUS

## 2024-03-29 MED ORDER — PROPOFOL 500 MG/50ML IV EMUL
INTRAVENOUS | Status: DC | PRN
  Administered 2024-03-29: 150 ug/kg/min via INTRAVENOUS

## 2024-03-29 MED ORDER — DROPERIDOL 2.5 MG/ML IJ SOLN
0.6250 mg | Freq: Once | INTRAMUSCULAR | Status: AC | PRN
  Administered 2024-03-29: 0.625 mg via INTRAVENOUS

## 2024-03-29 MED ORDER — FENTANYL CITRATE (PF) 100 MCG/2ML IJ SOLN
INTRAMUSCULAR | Status: AC
  Filled 2024-03-29: qty 2

## 2024-03-29 MED ORDER — GLUCAGON HCL RDNA (DIAGNOSTIC) 1 MG IJ SOLR
INTRAMUSCULAR | Status: AC
Start: 2024-03-29 — End: 2024-03-29
  Filled 2024-03-29: qty 1

## 2024-03-29 MED ORDER — GLUCAGON HCL RDNA (DIAGNOSTIC) 1 MG IJ SOLR
INTRAMUSCULAR | Status: DC | PRN
  Administered 2024-03-29: .5 mg via INTRAVENOUS

## 2024-03-29 MED ORDER — LIDOCAINE 2% (20 MG/ML) 5 ML SYRINGE
INTRAMUSCULAR | Status: DC | PRN
  Administered 2024-03-29: 60 mg via INTRAVENOUS

## 2024-03-29 MED ORDER — FENTANYL CITRATE (PF) 100 MCG/2ML IJ SOLN: 25.0000 ug | INTRAMUSCULAR | Status: DC | PRN

## 2024-03-29 MED ORDER — DEXMEDETOMIDINE HCL IN NACL 80 MCG/20ML IV SOLN
INTRAVENOUS | Status: DC | PRN
  Administered 2024-03-29 (×2): 8 ug via INTRAVENOUS

## 2024-03-29 MED ORDER — MEPERIDINE HCL 25 MG/ML IJ SOLN
6.2500 mg | INTRAMUSCULAR | Status: DC | PRN
  Filled 2024-03-29: qty 1

## 2024-03-29 MED ORDER — CIPROFLOXACIN IN D5W 400 MG/200ML IV SOLN
INTRAVENOUS | Status: DC | PRN
  Administered 2024-03-29: 400 mg via INTRAVENOUS

## 2024-03-29 MED ORDER — ROCURONIUM BROMIDE 10 MG/ML (PF) SYRINGE
PREFILLED_SYRINGE | INTRAVENOUS | Status: DC | PRN
  Administered 2024-03-29: 40 mg via INTRAVENOUS
  Administered 2024-03-29: 20 mg via INTRAVENOUS

## 2024-03-29 MED ORDER — CIPROFLOXACIN IN D5W 400 MG/200ML IV SOLN
INTRAVENOUS | Status: AC
  Filled 2024-03-29: qty 200

## 2024-03-29 MED ORDER — MAGNESIUM SULFATE 4 GM/100ML IV SOLN
4.0000 g | Freq: Once | INTRAVENOUS | Status: DC
  Filled 2024-03-29: qty 100

## 2024-03-29 MED ORDER — PROPOFOL 10 MG/ML IV BOLUS
INTRAVENOUS | Status: DC | PRN
  Administered 2024-03-29: 120 mg via INTRAVENOUS

## 2024-03-29 MED ORDER — MIDAZOLAM HCL 2 MG/2ML IJ SOLN
INTRAMUSCULAR | Status: AC
  Filled 2024-03-29: qty 2

## 2024-03-29 MED ORDER — POTASSIUM CHLORIDE 10 MEQ/100ML IV SOLN: 10.0000 meq | INTRAVENOUS | Status: AC

## 2024-03-29 MED ORDER — DICLOFENAC SUPPOSITORY 100 MG
RECTAL | Status: AC
  Filled 2024-03-29: qty 1

## 2024-03-29 MED ORDER — DROPERIDOL 2.5 MG/ML IJ SOLN
INTRAMUSCULAR | Status: AC
  Filled 2024-03-29: qty 2

## 2024-03-29 NOTE — Transfer of Care (Signed)
 Immediate Anesthesia Transfer of Care Note  Patient: Mackenzie Douglas  Procedure(s) Performed: ERCP, WITH INTERVENTION IF INDICATED  Patient Location: PACU  Anesthesia Type:General  Level of Consciousness: awake, drowsy, and responds to stimulation  Airway & Oxygen Therapy: Patient Spontanous Breathing and Patient connected to face mask oxygen  Post-op Assessment: Report given to RN, Post -op Vital signs reviewed and stable, and Patient moving all extremities X 4  Post vital signs: Reviewed and stable  Last Vitals:  Vitals Value Taken Time  BP 95/57 03/29/24 13:45  Temp 36.8 C 03/29/24 13:25  Pulse 61 03/29/24 13:56  Resp 9 03/29/24 13:55  SpO2 96 % 03/29/24 13:56  Vitals shown include unfiled device data.  Last Pain:  Vitals:   03/29/24 1345  TempSrc:   PainSc: 0-No pain      Patients Stated Pain Goal: 0 (03/29/24 0312)  Complications: No notable events documented.

## 2024-03-29 NOTE — Anesthesia Procedure Notes (Signed)
 Procedure Name: Intubation Date/Time: 03/29/2024 1:57 PM  Performed by: Mollie Olivia SAUNDERS, CRNAPre-anesthesia Checklist: Patient identified, Emergency Drugs available, Suction available and Patient being monitored Patient Re-evaluated:Patient Re-evaluated prior to induction Oxygen Delivery Method: Circle system utilized Preoxygenation: Pre-oxygenation with 100% oxygen Induction Type: IV induction Ventilation: Mask ventilation without difficulty Laryngoscope Size: 3 and Mac Grade View: Grade I Tube type: Oral Tube size: 7.0 mm Number of attempts: 1 Airway Equipment and Method: Stylet Placement Confirmation: ETT inserted through vocal cords under direct vision, positive ETCO2 and breath sounds checked- equal and bilateral Tube secured with: Tape Dental Injury: Teeth and Oropharynx as per pre-operative assessment

## 2024-03-29 NOTE — Interval H&P Note (Signed)
 History and Physical Interval Note:  03/29/2024 11:42 AM  Mackenzie Douglas  has presented today for surgery, with the diagnosis of Common bile duct stone.  The various methods of treatment have been discussed with the patient and family. After consideration of risks, benefits and other options for treatment, the patient has consented to  Procedure(s): ERCP, WITH INTERVENTION IF INDICATED (N/A) as a surgical intervention.  The patient's history has been reviewed, patient examined, no change in status, stable for surgery.  I have reviewed the patient's chart and labs.  Questions were answered to the patient's satisfaction.     Lupita Commander

## 2024-03-29 NOTE — Plan of Care (Signed)
   Problem: Education: Goal: Knowledge of General Education information will improve Description Including pain rating scale, medication(s)/side effects and non-pharmacologic comfort measures Outcome: Progressing

## 2024-03-29 NOTE — Progress Notes (Signed)
 [facility] PROGRESS NOTE   Mackenzie Douglas FMW:[id number] DOB: [birth year] DOA: 03/28/2024  PCP: Cecily Katz, PA-C  Brief History: 43 y.o. female with past medical history  of hypothyroidism, cholecystectomy, obesity, presented to [facility] with complaints of abdominal pain.  Was found to have choledocholithiasis.  She was sent over to [facility] for further workup.     Consultants: Gastroenterology  Procedures: ERCP is planned for today    Subjective/Interval History: Patient denies any abdominal pain headaches nausea vomiting.  She was told about the findings of her pelvic ultrasound   Assessment/Plan:  Choledocholithiasis Patient presented with abdominal pain.  CT scan raise concern for choledocholithiasis.  Patient underwent MRCP which confirmed choledocholithiasis. AST ALT were noted to be elevated.  Alkaline phosphatase mildly elevated.  Bilirubin noted to be normal. Patient seen by gastroenterology.  Plan is for ERCP during this hospitalization.  Patient is currently n.p.o. for possible ERCP later today. She is afebrile with normal WBC.  Not on any antibiotics currently.  Hepatic lesion Indeterminate lesion identified on MRI.  Follow-up imaging in 3 months suggested.  Fibroid uterus A 9 cm lesion identified most likely a fibroid.  Patient mentioned that her LMP was 2 weeks ago.  Urine pregnancy test is negative.  Pelvic ultrasound confirmed fibroid uterus.  She was told about this finding and was told that she should follow-up with gynecology.  Right renal cyst Simple cyst identified incidentally on CT scan.  Normocytic anemia No evidence of overt bleeding.   Drop in hemoglobin is likely dilutional. Anemia panel shows ferritin of 12, iron 61, TIBC 413, percent saturation 15, folic acid 9.4, B12 540.  Could have iron deficiency.  Iron tablets can be prescribed at discharge.  Hypokalemia/hypomagnesemia Continue to  supplement.   DVT Prophylaxis: Subcutaneous heparin Code Status: Full code Family Communication: Discussed with patient Disposition Plan: Hopefully return home when improved     Medications: Scheduled:  pantoprazole (PROTONIX) IV  40 mg Intravenous Q12H   sodium chloride flush  3 mL Intravenous Q12H   Continuous:  sodium chloride 100 mL/hr at 03/29/24 0300   magnesium sulfate bolus IVPB     potassium chloride     PRN:acetaminophen  **OR** acetaminophen , HYDROcodone-acetaminophen , morphine injection    Objective:  Vital Signs  Vitals:   03/28/24 0500 03/28/24 1500 03/28/24 1824 03/29/24 0000  BP:  115/71 115/71 (!) 98/47  Pulse:  87 87 83  Resp:   17 16  Temp:  98.8 F (37.1 C) 98.8 F (37.1 C) 98.6 F (37 C)  TempSrc:  Oral Oral Oral  SpO2:  100% 100% 99%  Weight: 63.1 kg  63.1 kg   Height:   5' 2 (1.575 m)     Intake/Output Summary (Last 24 hours) at 03/29/2024 0930 Last data filed at 03/29/2024 0300 Gross per 24 hour  Intake 690.85 ml  Output --  Net 690.85 ml   Filed Weights   03/28/24 0500 03/28/24 1824  Weight: 63.1 kg 63.1 kg    General appearance: Awake alert.  In no distress Resp: Clear to auscultation bilaterally.  Normal effort Cardio: S1-S2 is normal regular.  No S3-S4.  No rubs murmurs or bruit GI: Abdomen is soft.  Nontender nondistended.  Bowel sounds are present normal.  No masses organomegaly Extremities: No edema.  Full range of motion of lower extremities. Neurologic: Alert and oriented x3.  No focal neurological deficits.    Lab Results:  Data Reviewed: I have personally reviewed following labs  and reports of the imaging studies  CBC: Recent Labs  Lab 03/27/24 1400 03/28/24 0521 03/29/24 0429  WBC 14.3* 9.8 6.5  NEUTROABS  --  8.2*  --   HGB 10.4* 10.1* 9.3*  HCT 33.4* 32.1* 29.1*  MCV 81.1 80.0 81.5  PLT 467* 433* 355    Basic Metabolic Panel: Recent Labs  Lab 03/27/24 1400 03/28/24 0521 03/29/24 0429  NA 137 138  139  K 3.2* 3.4* 3.4*  CL 100 101 103  CO2 26 26 24   GLUCOSE 117* 109* 105*  BUN 12 7 6   CREATININE 0.45 0.66 0.62  CALCIUM 9.7 9.0 8.5*  MG  --  1.6* 1.7    GFR: Estimated Creatinine Clearance: 80.8 mL/min (by C-G formula based on SCr of 0.62 mg/dL).  Liver Function Tests: Recent Labs  Lab 03/27/24 1400 03/28/24 0521 03/29/24 0429  AST 388* 485* 132*  ALT 230* 681* 374*  ALKPHOS 129* 132* 128*  BILITOT 1.2 1.1 0.6  PROT 7.9 6.6 6.1*  ALBUMIN 4.3 3.4* 3.1*    Recent Labs  Lab 03/27/24 1400  LIPASE 31   Coagulation Profile: Recent Labs  Lab 03/27/24 1610 03/28/24 1318  INR 1.0 1.2    Anemia Panel: Recent Labs    03/28/24 0808  VITAMINB12 540  FOLATE 9.4  FERRITIN 12  TIBC 413  IRON 61  RETICCTPCT 0.8    Radiology Studies: US  PELVIC COMPLETE WITH TRANSVAGINAL Result Date: 03/28/2024 CLINICAL DATA:  Uterine Mass. EXAM: TRANSABDOMINAL ULTRASOUND OF PELVIS TECHNIQUE: Transabdominal ultrasound examination of the pelvis was performed including evaluation of the uterus, ovaries, adnexal regions, and pelvic cul-de-sac. COMPARISON:  MR abdomen and CT abdomen pelvis 03/27/2024. FINDINGS: Uterus Measurements: 14.2 x 11.4 x 11.2 cm = volume: 943.3 mL. Large fundal heterogeneous mass measures 8.8 x 6.6 x 9.0 cm. Endometrium Thickness: 6 mm, within normal limits. No focal abnormality visualized. Right ovary Measurements: 4.7 x 1.6 x 2.8 cm = volume: 10.8 mL. Normal appearance/no adnexal mass. Left ovary Measurements: 3.9 x 1.5 x 3.2 cm = volume: 10.1 mL. Normal appearance/no adnexal mass. Other findings:  No abnormal free fluid. IMPRESSION: Large fundal fibroid. Electronically Signed   By: Newell Eke M.D.   On: 03/28/2024 15:43   MR ABDOMEN MRCP W WO CONTAST Result Date: 03/28/2024 EXAM: MRCP WITH IV CONTRAST 03/27/2024 09:24:11 PM TECHNIQUE: Multisequence, multiplanar magnetic resonance images of the abdomen without and with intravenous contrast. MRCP sequences were  performed. Exam detail diminished by motion artifact. COMPARISON: Comparison 03/27/2024 at 5:23 pm. CLINICAL HISTORY: 175970 Biliary obstruction (HCC) 824029; suspected biliary obstruction. FINDINGS: LIVER: Hepatic steatosis. T2 hyperintense T1 isointense to hypointense structure within segment 3 without restricted diffusion is identified measuring 1.7 x 1.1 cm, image 20/8. This is indeterminate but does not show definitive enhancement on the postcontrast images. GALLBLADDER AND BILIARY SYSTEM: Gallbladder is surgically absent. There is an impacted stone within the common bile duct at the level of the head of pancreas as noted on the CT measuring 1.5 cm, image 39/20. Moderate intrahepatic bile duct dilatation with possible enhancement along the biliary radicles. SPLEEN: The spleen is within normal limits in size and appearance. PANCREAS/PANCREATIC DUCT: The pancreas is normal in size and contour, without focal lesion or ductal dilatation. ADRENAL GLANDS: Normal size and morphology bilaterally. No nodule, thickening, or hemorrhage. No periadrenal stranding. KIDNEYS: Bosniak class cysts within the medial cortex of the interpolar right kidney measure 0.8 cm, image 25/8. No follow-up imaging recommended. LYMPH NODES: No enlarged abdominal lymph nodes.  VASULATURE: Unremarkable. PERITONEUM: Small volume of perihepatic fluid identified. No focal fluid collections. ABDOMINAL WALL: No hernia. No mass. BOWEL: Grossly unremarkable. No bowel obstruction. BONES: No acute abnormality or worrisome osseous lesion. SOFT TISSUES: Unremarkable. MISCELLANEOUS: Fibroid uterus is again noted. IMPRESSION: 1. Choledocholithiasis with an impacted 1.5 cm common bile duct stone at the pancreatic head and associated moderate intrahepatic bile duct dilatation, with possible biliary radicle enhancement. 2. Indeterminate 1.7 x 1.1 cm T2 hyperintense, T1 iso- to hypointense lesion in hepatic segment 3 without restricted diffusion or definitive  postcontrast enhancement. Suggest follow-up imaging in 3 months with repeat MRI with contrast material to assess for stability. Electronically signed by: Waddell Calk MD 03/28/2024 05:15 AM EST RP Workstation: [id number]   MR 3D Recon At Scanner Result Date: 03/28/2024 EXAM: MRCP WITH IV CONTRAST 03/27/2024 09:24:11 PM TECHNIQUE: Multisequence, multiplanar magnetic resonance images of the abdomen without and with intravenous contrast. MRCP sequences were performed. Exam detail diminished by motion artifact. COMPARISON: Comparison 03/27/2024 at 5:23 pm. CLINICAL HISTORY: 175970 Biliary obstruction (HCC) 824029; suspected biliary obstruction. FINDINGS: LIVER: Hepatic steatosis. T2 hyperintense T1 isointense to hypointense structure within segment 3 without restricted diffusion is identified measuring 1.7 x 1.1 cm, image 20/8. This is indeterminate but does not show definitive enhancement on the postcontrast images. GALLBLADDER AND BILIARY SYSTEM: Gallbladder is surgically absent. There is an impacted stone within the common bile duct at the level of the head of pancreas as noted on the CT measuring 1.5 cm, image 39/20. Moderate intrahepatic bile duct dilatation with possible enhancement along the biliary radicles. SPLEEN: The spleen is within normal limits in size and appearance. PANCREAS/PANCREATIC DUCT: The pancreas is normal in size and contour, without focal lesion or ductal dilatation. ADRENAL GLANDS: Normal size and morphology bilaterally. No nodule, thickening, or hemorrhage. No periadrenal stranding. KIDNEYS: Bosniak class cysts within the medial cortex of the interpolar right kidney measure 0.8 cm, image 25/8. No follow-up imaging recommended. LYMPH NODES: No enlarged abdominal lymph nodes. VASULATURE: Unremarkable. PERITONEUM: Small volume of perihepatic fluid identified. No focal fluid collections. ABDOMINAL WALL: No hernia. No mass. BOWEL: Grossly unremarkable. No bowel obstruction. BONES: No acute  abnormality or worrisome osseous lesion. SOFT TISSUES: Unremarkable. MISCELLANEOUS: Fibroid uterus is again noted. IMPRESSION: 1. Choledocholithiasis with an impacted 1.5 cm common bile duct stone at the pancreatic head and associated moderate intrahepatic bile duct dilatation, with possible biliary radicle enhancement. 2. Indeterminate 1.7 x 1.1 cm T2 hyperintense, T1 iso- to hypointense lesion in hepatic segment 3 without restricted diffusion or definitive postcontrast enhancement. Suggest follow-up imaging in 3 months with repeat MRI with contrast material to assess for stability. Electronically signed by: Waddell Calk MD 03/28/2024 05:15 AM EST RP Workstation: [id number]   CT ABDOMEN PELVIS W CONTRAST Result Date: 03/27/2024 EXAM: CT ABDOMEN AND PELVIS WITH CONTRAST 03/27/2024 05:35:42 PM TECHNIQUE: CT of the abdomen and pelvis was performed with the administration of 100 mL of iohexol (OMNIPAQUE) 300 MG/ML solution. Multiplanar reformatted images are provided for review. Automated exposure control, iterative reconstruction, and/or weight-based adjustment of the mA/kV was utilized to reduce the radiation dose to as low as reasonably achievable. COMPARISON: None available. CLINICAL HISTORY: Abdominal pain, acute, nonlocalized. FINDINGS: LOWER CHEST: No acute abnormality. LIVER: Mild intrahepatic biliary ductal dilatation and pneumobilia. GALLBLADDER AND BILE DUCTS: Gallbladder is surgically absent. Choledocholithiasis is present. The common bile duct is dilated measuring up to 12 mm. SPLEEN: No acute abnormality. PANCREAS: No acute abnormality. ADRENAL GLANDS: No acute abnormality. KIDNEYS, URETERS AND BLADDER: There is  a 9 mm right renal cyst. Per consensus, no follow-up is needed for simple Bosniak type 1 and 2 renal cysts, unless the patient has a malignancy history or risk factors. No stones in the kidneys or ureters. No hydronephrosis. No perinephric or periureteral stranding. Urinary bladder is  unremarkable. GI AND BOWEL: Stomach demonstrates no acute abnormality. Appendix is normal. There is no bowel obstruction. PERITONEUM AND RETROPERITONEUM: No ascites. No free air. VASCULATURE: Aorta is normal in caliber. LYMPH NODES: No lymphadenopathy. REPRODUCTIVE ORGANS: There is a small amount of fluid in the endometrial canal. There is a 9 cm heterogeneous mass in posterior uterus, likely a fibroid. There is some central areas of hyperdensity and internal hemorrhage cannot be excluded within this fibroid. Adnexa are within normal limits. BONES AND SOFT TISSUES: No acute osseous abnormality. No focal soft tissue abnormality. IMPRESSION: 1. Choledocholithiasis with dilated common bile duct (up to 12 mm), mild intrahepatic biliary ductal dilatation, and pneumobilia  recommend correlation with prior imaging and clinical evaluation for possible biliary obstruction; consider hepatobiliary ultrasound or MRCP and gastroenterology/hepatobiliary consultation as clinically indicated. 2. 9 cm heterogeneous mass in posterior uterus, likely a fibroid, with possible internal hemorrhage  recommend gynecologic correlation; consider pelvic ultrasound or MRI with contrast for further characterization and management. 3. 9 mm right renal cyst  simple-appearing; no follow-up recommended. 4. Small amount of fluid in the endometrial canal  correlate with menstrual history; gynecologic follow-up as indicated. Electronically signed by: Greig Pique MD 03/27/2024 06:12 PM EST RP Workstation: [id number]       LOS: 1 day   Joette Pebbles  [facility] Pager on [email or web address]  03/29/2024, 9:30 AM

## 2024-03-29 NOTE — Anesthesia Postprocedure Evaluation (Signed)
 Anesthesia Post Note  Patient: Mackenzie Douglas  Procedure(s) Performed: ERCP, WITH INTERVENTION IF INDICATED     Patient location during evaluation: PACU Anesthesia Type: General Level of consciousness: sedated and patient cooperative Pain management: pain level controlled Vital Signs Assessment: post-procedure vital signs reviewed and stable Respiratory status: spontaneous breathing Cardiovascular status: stable Anesthetic complications: no   No notable events documented.  Last Vitals:  Vitals:   03/29/24 1426 03/29/24 1606  BP: (!) 95/55 108/60  Pulse: (!) 58 65  Resp: 18 17  Temp: 36.8 C 37.3 C  SpO2: 98% 98%    Last Pain:  Vitals:   03/29/24 2048  TempSrc:   PainSc: 0-No pain                 Norleen Pope

## 2024-03-29 NOTE — Op Note (Addendum)
 [facility] Patient Name: Mackenzie Douglas Procedure Date : 03/29/2024 MRN: [id number] Attending MD: Lupita FORBES Commander , MD, [id number] Date of Birth: 1980/06/20 CSN: [id number] Age: 43 Admit Type: Inpatient Procedure:                ERCP Indications:              Bile duct stone(s), Jaundice status                            postcholecystectomy without IOC 2023.                            Cross-sectional imaging including MRCP shows                            impacted common bile duct stone and pneumobilia. No                            history of biliary sphincterotomy or other                            intervention that would cause pneumobilia. Providers:                Lupita CHARLENA Commander, MD, Collene Edu, RN, [facility]                            [place], Technician Referring MD:             Nilsa Dade Medicines:                General Anesthesia, Cipro 400 mg IV, Diclofenac 100                            mg per rectum Complications:            No immediate complications. Estimated Blood Loss:     Estimated blood loss: none. Procedure:                Pre-Anesthesia Assessment:                           - Prior to the procedure, a History and Physical                            was performed, and patient medications and                            allergies were reviewed. The patient's tolerance of                            previous anesthesia was also reviewed. The risks                            and benefits of the procedure and the sedation                            options and  risks were discussed with the patient.                            All questions were answered, and informed consent                            was obtained. Prior Anticoagulants: The patient has                            taken no anticoagulant or antiplatelet agents. ASA                            Grade Assessment: II - A patient with mild systemic                            disease. After  reviewing the risks and benefits,                            the patient was deemed in satisfactory condition to                            undergo the procedure.                           After obtaining informed consent, the scope was                            passed under direct vision. Throughout the                            procedure, the patient's blood pressure, pulse, and                            oxygen saturations were monitored continuously. The                            [employer] D single use                            duodenoscope was introduced through the mouth, and                            used to inject contrast into and used to inject                            contrast into the bile duct. The ERCP was somewhat                            difficult due to a large stone. Successful                            completion of the procedure was aided by stent. The  patient tolerated the procedure well. Scope In: Scope Out: Findings:      Esophagus not seen well due to side-viewing nature of the scope. Stomach       grossly normal. Duodenal normal. The papilla was identified, initially       obscured by some redundant folds. The scout film was normal. I did not       see any clips from prior cholecystectomy. The papilla was cannulated       with an Rx 44 sphincterotome, using a 0.035 Hydra Jagwire. Pancreas was       entered with the wire, I was unable to enter the bile duct. I elected to       leave this wire in the pancreas, and then I switched to an Rx 39       sphincterotome and a 0.025 wire and was able to cannulate the bile duct       deeply. Contrast was injected revealing a large stone approximately 10       mm, that was impacted in the mid common bile duct, with some proximal       dilation of the common hepatic duct and intrahepatics. It was somewhat       difficult to pass the wire beyond this as it coiled but  eventually moved       beyond. I performed a small to medium biliary sphincterotomy. The       papilla was a little edematous and the overlying folds made watching the       landmarks a little difficult so I did not enlarge it further. No       bleeding. I exchanged for a 9-12 mm extraction balloon, this would not       move past the stone. At this point I removed the balloon catheter, I       then placed a 5 cm 4 French single pigtail pancreatic duct stent without       a flap to protect the pancreas and reduce the risk of post ERCP       pancreatitis given wire cannulation of the pancreatic duct. I       subsequently then placed a 7 French 7 cm plastic stent which was able to       go past and proximal to the stone providing good drainage. No other       abnormalities detected other than a possible more proximal small bile       duct stone versus air bubble. The patient did have pneumobilia seen on       imaging but I do not see an obvious cause for that. I personally       interpreted the fluoroscopic images. Impression:               - Choledocholithiasis with an obstruction was                            found. Removal was not attempted; a stent was                            inserted. Recommendation:           - Return to floor for further care                           Clear liquids  today may advance diet to regular                            low-fat tomorrow if doing well.                           She needs a repeat ERCP with stone removal from the                            bile duct. This could require contact lithotripsy.                            Will coordinate this with our advanced endoscopist                            Dr. Wilhelmenia versus referral elsewhere depending                            upon his availability. If the patient is doing well                            she may be discharged tomorrow.                           She does need a KUB in approximately 10 to  14 days                            to check for passage of the pancreatic duct stent.                            That is unless she has an ERCP within a month,                            approximately. I will sort this out and arrange. Procedure Code(s):        --- Professional ---                           [phone], Endoscopic retrograde                            cholangiopancreatography (ERCP); with placement of                            endoscopic stent into biliary or pancreatic duct,                            including pre- and post-dilation and guide wire                            passage, when performed, including sphincterotomy,                            when performed, each stent  [id number], 59, Endoscopic retrograde                            cholangiopancreatography (ERCP); with                            sphincterotomy/papillotomy                           [phone], Endoscopic catheterization of the biliary                            ductal system, radiological supervision and                            interpretation Diagnosis Code(s):        --- Professional ---                           K80.51, Calculus of bile duct without cholangitis                            or cholecystitis with obstruction                           R17, Unspecified jaundice CPT copyright 2022 [employer]. All rights reserved. The codes documented in this report are preliminary and upon coder review may  be revised to meet current compliance requirements. Lupita FORBES Commander, MD 03/29/2024 1:44:10 PM This report has been signed electronically. Number of Addenda: 0

## 2024-03-30 ENCOUNTER — Encounter (HOSPITAL_COMMUNITY): Payer: Self-pay | Admitting: Internal Medicine

## 2024-03-30 ENCOUNTER — Other Ambulatory Visit (HOSPITAL_COMMUNITY): Payer: Self-pay

## 2024-03-30 DIAGNOSIS — R748 Abnormal levels of other serum enzymes: Secondary | ICD-10-CM

## 2024-03-30 DIAGNOSIS — K8050 Calculus of bile duct without cholangitis or cholecystitis without obstruction: Secondary | ICD-10-CM

## 2024-03-30 LAB — COMPREHENSIVE METABOLIC PANEL WITH GFR
ALT: 259 U/L — ABNORMAL HIGH (ref 0–44)
AST: 51 U/L — ABNORMAL HIGH (ref 15–41)
Albumin: 2.9 g/dL — ABNORMAL LOW (ref 3.5–5.0)
Alkaline Phosphatase: 120 U/L (ref 38–126)
Anion gap: 13 (ref 5–15)
BUN: 11 mg/dL (ref 6–20)
CO2: 22 mmol/L (ref 22–32)
Calcium: 8.6 mg/dL — ABNORMAL LOW (ref 8.9–10.3)
Chloride: 105 mmol/L (ref 98–111)
Creatinine, Ser: 0.62 mg/dL (ref 0.44–1.00)
GFR, Estimated: >60 mL/min (ref >60)
Glucose, Bld: 89 mg/dL (ref 70–99)
Potassium: 3.7 mmol/L (ref 3.5–5.1)
Sodium: 140 mmol/L (ref 135–145)
Total Bilirubin: 0.5 mg/dL (ref 0.0–1.2)
Total Protein: 5.9 g/dL — ABNORMAL LOW (ref 6.5–8.1)

## 2024-03-30 LAB — CBC
HCT: 30.2 % — ABNORMAL LOW (ref 36.0–46.0)
Hemoglobin: 9.5 g/dL — ABNORMAL LOW (ref 12.0–15.0)
MCH: 25.3 pg — ABNORMAL LOW (ref 26.0–34.0)
MCHC: 31.5 g/dL (ref 30.0–36.0)
MCV: 80.5 fL (ref 80.0–100.0)
Platelets: 374 K/uL (ref 150–400)
RBC: 3.75 MIL/uL — ABNORMAL LOW (ref 3.87–5.11)
RDW: 15.1 % (ref 11.5–15.5)
WBC: 8.8 K/uL (ref 4.0–10.5)
nRBC: 0 % (ref 0.0–0.2)

## 2024-03-30 LAB — MAGNESIUM: Magnesium: 1.6 mg/dL — ABNORMAL LOW (ref 1.7–2.4)

## 2024-03-30 MED ORDER — PANTOPRAZOLE SODIUM 40 MG PO TBEC: 40.0000 mg | DELAYED_RELEASE_TABLET | Freq: Two times a day (BID) | ORAL | Status: DC

## 2024-03-30 MED ORDER — POTASSIUM CHLORIDE CRYS ER 20 MEQ PO TBCR
40.0000 meq | EXTENDED_RELEASE_TABLET | Freq: Once | ORAL | Status: AC
  Administered 2024-03-30: 40 meq via ORAL
  Filled 2024-03-30: qty 2

## 2024-03-30 MED ORDER — FERROUS SULFATE 325 (65 FE) MG PO TABS
325.0000 mg | ORAL_TABLET | Freq: Two times a day (BID) | ORAL | 0 refills | Status: AC
  Filled 2024-03-30: qty 180, 90d supply, fill #0

## 2024-03-30 MED ORDER — PANTOPRAZOLE SODIUM 40 MG PO TBEC
40.0000 mg | DELAYED_RELEASE_TABLET | Freq: Every day | ORAL | 0 refills | Status: AC
  Filled 2024-03-30: qty 30, 30d supply, fill #0

## 2024-03-30 MED ORDER — POLYETHYLENE GLYCOL 3350 17 GM/SCOOP PO POWD
17.0000 g | Freq: Two times a day (BID) | ORAL | 0 refills | Status: AC
  Filled 2024-03-30: qty 476, 14d supply, fill #0

## 2024-03-30 NOTE — Progress Notes (Signed)
 Discharge instructions given. PT verbalized understanding. Pt will be heading to D/C lounge. TOC meds will be picked up.

## 2024-03-30 NOTE — Plan of Care (Signed)
   Problem: Education: Goal: Knowledge of General Education information will improve Description Including pain rating scale, medication(s)/side effects and non-pharmacologic comfort measures Outcome: Progressing

## 2024-03-30 NOTE — Progress Notes (Signed)
 [facility] PROGRESS NOTE   Mackenzie Douglas FMW:[id number] DOB: 12-Sep-1980 DOA: 03/28/2024  PCP: Cecily Katz, PA-C  Brief History: 43 y.o. female with past medical history  of hypothyroidism, cholecystectomy, obesity, presented to [facility] with complaints of abdominal pain.  Was found to have choledocholithiasis.  She was sent over to [facility] for further workup.     Consultants: Gastroenterology  Procedures:  ERCP 11/9 Findings:      Esophagus not seen well due to side-viewing nature of the scope. Stomach       grossly normal. Duodenal normal. The papilla was identified, initially       obscured by some redundant folds. The scout film was normal. I did not       see any clips from prior cholecystectomy. The papilla was cannulated       with an Rx 44 sphincterotome, using a 0.035 Hydra Jagwire. Pancreas was       entered with the wire, I was unable to enter the bile duct. I elected to       leave this wire in the pancreas, and then I switched to an Rx 39       sphincterotome and a 0.025 wire and was able to cannulate the bile duct       deeply. Contrast was injected revealing a large stone approximately 10       mm, that was impacted in the mid common bile duct, with some proximal       dilation of the common hepatic duct and intrahepatics. It was somewhat       difficult to pass the wire beyond this as it coiled but eventually moved       beyond. I performed a small to medium biliary sphincterotomy. The       papilla was a little edematous and the overlying folds made watching the       landmarks a little difficult so I did not enlarge it further. No       bleeding. I exchanged for a 9-12 mm extraction balloon, this would not       move past the stone. At this point I removed the balloon catheter, I       then placed a 5 cm 4 French single pigtail pancreatic duct stent without       a flap to protect the pancreas and reduce the risk of post  ERCP       pancreatitis given wire cannulation of the pancreatic duct. I       subsequently then placed a 7 French 7 cm plastic stent which was able to       go past and proximal to the stone providing good drainage. No other       abnormalities detected other than a possible more proximal small bile       duct stone versus air bubble. The patient did have pneumobilia seen on       imaging but I do not see an obvious cause for that. I personally       interpreted the fluoroscopic images. Impression:               - Choledocholithiasis with an obstruction was                            found. Removal was not attempted; a stent was  inserted.    Subjective/Interval History: Patient denies any abdominal pain nausea vomiting.     Assessment/Plan:  Choledocholithiasis Patient presented with abdominal pain.  CT scan raise concern for choledocholithiasis.  Patient underwent MRCP which confirmed choledocholithiasis. AST ALT were noted to be elevated.  Alkaline phosphatase mildly elevated.  Bilirubin noted to be normal. Patient seen by gastroenterology.   Patient underwent ERCP on 9/9.  However due to the size of the stone it cannot be removed.  Stents were placed.  Biliary sphincterotomy was performed. Await further recommendations from gastroenterology. She remains asymptomatic.  She is afebrile with normal WBC.  LFTs are improving. Anticipate she might be able to go home today with further management of this issue in the outpatient setting.  Hepatic lesion Indeterminate lesion identified on MRI.  Follow-up imaging in 3 months suggested.  This can be pursued by gastroenterology.  Fibroid uterus A 9 cm lesion identified most likely a fibroid.  Patient mentioned that her LMP was 2 weeks ago.  Urine pregnancy test is negative.  Pelvic ultrasound confirmed fibroid uterus.  She was told about this finding and was told that she should follow-up with gynecology.  Will  [facility] at the time of discharge.  Right renal cyst Simple cyst identified incidentally on CT scan.  Normocytic anemia No evidence of overt bleeding.   Drop in hemoglobin is likely dilutional. Anemia panel shows ferritin of 12, iron 61, TIBC 413, percent saturation 15, folic acid 9.4, B12 540.  Could have iron deficiency.  Iron tablets can be prescribed at discharge.  Hypokalemia/hypomagnesemia Potassium level is normal.  Magnesium remains low and will be supplemented today.   DVT Prophylaxis: Subcutaneous heparin Code Status: Full code Family Communication: Discussed with patient Disposition Plan: Hopefully return home when improved     Medications: Scheduled:  levothyroxine  137 mcg Oral QAC breakfast   pantoprazole (PROTONIX) IV  40 mg Intravenous Q12H   sodium chloride flush  3 mL Intravenous Q12H   Continuous:  magnesium sulfate bolus IVPB     PRN:acetaminophen  **OR** acetaminophen , HYDROcodone-acetaminophen , morphine injection    Objective:  Vital Signs  Vitals:   03/29/24 1606 03/29/24 2300 03/30/24 0500 03/30/24 0753  BP: 108/60 119/72  112/66  Pulse: 65 68  70  Resp: 17 16  18   Temp: 99.1 F (37.3 C) 98.2 F (36.8 C)  98.2 F (36.8 C)  TempSrc: Oral Oral  Oral  SpO2: 98% 100%  100%  Weight:   64 kg   Height:        Intake/Output Summary (Last 24 hours) at 03/30/2024 1001 Last data filed at 03/30/2024 0600 Gross per 24 hour  Intake 1650 ml  Output 400 ml  Net 1250 ml   Filed Weights   03/28/24 0500 03/28/24 1824 03/30/24 0500  Weight: 63.1 kg 63.1 kg 64 kg    General appearance: Awake alert.  In no distress Resp: Clear to auscultation bilaterally.  Normal effort Cardio: S1-S2 is normal regular.  No S3-S4.  No rubs murmurs or bruit GI: Abdomen is soft.  Nontender nondistended.  Bowel sounds are present normal.  No masses organomegaly Extremities: No edema.  Full range of motion of lower  extremities. Neurologic: Alert and oriented x3.  No focal neurological deficits.    Lab Results:  Data Reviewed: I have personally reviewed following labs and reports of the imaging studies  CBC: Recent Labs  Lab 03/27/24 1400 03/28/24 0521 03/29/24 0429 03/30/24 0440  WBC 14.3* 9.8 6.5 8.8  NEUTROABS  --  8.2*  --   --   HGB 10.4* 10.1* 9.3* 9.5*  HCT 33.4* 32.1* 29.1* 30.2*  MCV 81.1 80.0 81.5 80.5  PLT 467* 433* 355 374    Basic Metabolic Panel: Recent Labs  Lab 03/27/24 1400 03/28/24 0521 03/29/24 0429 03/30/24 0440  NA 137 138 139 140  K 3.2* 3.4* 3.4* 3.7  CL 100 101 103 105  CO2 26 26 24 22   GLUCOSE 117* 109* 105* 89  BUN 12 7 6 11   CREATININE 0.45 0.66 0.62 0.62  CALCIUM 9.7 9.0 8.5* 8.6*  MG  --  1.6* 1.7 1.6*    GFR: Estimated Creatinine Clearance: 81.4 mL/min (by C-G formula based on SCr of 0.62 mg/dL).  Liver Function Tests: Recent Labs  Lab 03/27/24 1400 03/28/24 0521 03/29/24 0429 03/30/24 0440  AST 388* 485* 132* 51*  ALT 230* 681* 374* 259*  ALKPHOS 129* 132* 128* 120  BILITOT 1.2 1.1 0.6 0.5  PROT 7.9 6.6 6.1* 5.9*  ALBUMIN 4.3 3.4* 3.1* 2.9*    Recent Labs  Lab 03/27/24 1400  LIPASE 31   Coagulation Profile: Recent Labs  Lab 03/27/24 1610 03/28/24 1318  INR 1.0 1.2    Anemia Panel: Recent Labs    03/28/24 0808  VITAMINB12 540  FOLATE 9.4  FERRITIN 12  TIBC 413  IRON 61  RETICCTPCT 0.8    Radiology Studies: DG ERCP Result Date: 03/29/2024 CLINICAL DATA:  Jaundiced status post cholecystectomy with abnormal MRCP suggesting common bile duct stones. EXAM: ERCP 10 fluoroscopic images of the right upper quadrant TECHNIQUE: Multiple spot images obtained with the fluoroscopic device and submitted for interpretation post-procedure. FLUOROSCOPY: Radiation Exposure Index (as provided by the fluoroscopic device): 64.42 mGy Kerma COMPARISON:  None Available. FINDINGS: Fluoroscopic images of the right upper quadrant demonstrate  flexible endoscopy device with guidewire extending into the common bile duct as well as the pancreatic duct. Interval placement of a pancreatic duct stent and common bile duct stent. Mild dilatation of the proximal pancreatic duct. Interval placement of a common duct plastic stent. No contrast extravasation. IMPRESSION: ERCP with placement of pancreatic duct stent and common bile duct stent. These images were submitted for radiologic interpretation only. Please see the procedural report for the amount of contrast and the fluoroscopy time utilized. Electronically Signed   By: Cordella Banner   On: 03/29/2024 18:51   DG C-Arm 1-60 Min-No Report Result Date: 03/29/2024 Fluoroscopy was utilized by the requesting physician.  No radiographic interpretation.   US  PELVIC COMPLETE WITH TRANSVAGINAL Result Date: 03/28/2024 CLINICAL DATA:  Uterine Mass. EXAM: TRANSABDOMINAL ULTRASOUND OF PELVIS TECHNIQUE: Transabdominal ultrasound examination of the pelvis was performed including evaluation of the uterus, ovaries, adnexal regions, and pelvic cul-de-sac. COMPARISON:  MR abdomen and CT abdomen pelvis 03/27/2024. FINDINGS: Uterus Measurements: 14.2 x 11.4 x 11.2 cm = volume: 943.3 mL. Large fundal heterogeneous mass measures 8.8 x 6.6 x 9.0 cm. Endometrium Thickness: 6 mm, within normal limits. No focal abnormality visualized. Right ovary Measurements: 4.7 x 1.6 x 2.8 cm = volume: 10.8 mL. Normal appearance/no adnexal mass. Left ovary Measurements: 3.9 x 1.5 x 3.2 cm = volume: 10.1 mL. Normal appearance/no adnexal mass. Other findings:  No abnormal free fluid. IMPRESSION: Large fundal fibroid. Electronically Signed   By: Newell Eke M.D.   On: 03/28/2024 15:43       LOS: 2 days   Mackenzie Douglas  [facility] Pager on [email or web address]  03/30/2024,  10:01 AM

## 2024-03-30 NOTE — Discharge Summary (Incomplete)
 [facility]  Physician Discharge Summary   Patient ID: Mackenzie Douglas MRN: [id number] DOB/AGE: 41-Sep-1982 41 y.o.  Admit date: 03/28/2024 Discharge date: 03/30/2024    PCP: Cecily Katz, PA-C  DISCHARGE DIAGNOSES:    Calculus of bile duct without cholangitis with obstruction   Choledocholithiasis   Elevated liver enzymes Uterine fibroid   RECOMMENDATIONS FOR OUTPATIENT FOLLOW UP: GI to schedule outpatient follow-up for ERCP Patient to call center for women's health for further management of uterine fibroid   Home Health: None Equipment/Devices: None  CODE STATUS: Full code  DISCHARGE CONDITION: fair  Diet recommendation: As before  INITIAL HISTORY: 43 y.o. female with past medical history  of hypothyroidism, cholecystectomy, obesity, presented to [facility] with complaints of abdominal pain.  Was found to have choledocholithiasis.  She was sent over to [facility] for further workup.      Consultants: Gastroenterology   Procedures:  ERCP 11/9 Findings:      Esophagus not seen well due to side-viewing nature of the scope. Stomach       grossly normal. Duodenal normal. The papilla was identified, initially       obscured by some redundant folds. The scout film was normal. I did not       see any clips from prior cholecystectomy. The papilla was cannulated       with an Rx 44 sphincterotome, using a 0.035 Hydra Jagwire. Pancreas was       entered with the wire, I was unable to enter the bile duct. I elected to       leave this wire in the pancreas, and then I switched to an Rx 39       sphincterotome and a 0.025 wire and was able to cannulate the bile duct       deeply. Contrast was injected revealing a large stone approximately 10       mm, that was impacted in the mid common bile duct, with some proximal       dilation of the common hepatic duct and intrahepatics. It was somewhat       difficult to pass the wire beyond this as it  coiled but eventually moved       beyond. I performed a small to medium biliary sphincterotomy. The       papilla was a little edematous and the overlying folds made watching the       landmarks a little difficult so I did not enlarge it further. No       bleeding. I exchanged for a 9-12 mm extraction balloon, this would not       move past the stone. At this point I removed the balloon catheter, I       then placed a 5 cm 4 French single pigtail pancreatic duct stent without       a flap to protect the pancreas and reduce the risk of post ERCP       pancreatitis given wire cannulation of the pancreatic duct. I       subsequently then placed a 7 French 7 cm plastic stent which was able to       go past and proximal to the stone providing good drainage. No other       abnormalities detected other than a possible more proximal small bile       duct stone versus air bubble. The patient did have pneumobilia seen on       imaging  but I do not see an obvious cause for that. I personally       interpreted the fluoroscopic images. Impression:               - Choledocholithiasis with an obstruction was                            found. Removal was not attempted; a stent was                            inserted.      HOSPITAL COURSE:   Choledocholithiasis Patient presented with abdominal pain.  CT scan raise concern for choledocholithiasis.  Patient underwent MRCP which confirmed choledocholithiasis. AST ALT were noted to be elevated.  Alkaline phosphatase mildly elevated.  Bilirubin noted to be normal. Patient seen by gastroenterology.   Patient underwent ERCP on 9/9.  However due to the size of the stone it cannot be removed.  Stents were placed.  Biliary sphincterotomy was performed. She remains asymptomatic.  She is afebrile with normal WBC.  LFTs are improving. Cleared by gastroenterology for discharge.  They will schedule outpatient follow-up for repeat ERCP in the next few weeks.   Hepatic  lesion Indeterminate lesion identified on MRI.  Follow-up imaging in 3 months suggested.  This can be pursued by gastroenterology.   Fibroid uterus A 9 cm lesion identified most likely a fibroid.  Patient mentioned that her LMP was 2 weeks ago.  Urine pregnancy test is negative.  Pelvic ultrasound confirmed fibroid uterus.  She was told about this finding and was told that she should follow-up with gynecology.  Will provide contact information for the [facility] at the time of discharge.   Right renal cyst Simple cyst identified incidentally on CT scan.   Normocytic anemia No evidence of overt bleeding.   Drop in hemoglobin is likely dilutional. Anemia panel shows ferritin of 12, iron 61, TIBC 413, percent saturation 15, folic acid 9.4, B12 540.  Could have iron deficiency.  Iron tablets can be prescribed at discharge.   Hypokalemia/hypomagnesemia Potassium level is normal.  Magnesium remains low and supplemented today.  Patient is stable.  Okay for discharge home.     PERTINENT LABS:  The results of significant diagnostics from this hospitalization (including imaging, microbiology, ancillary and laboratory) are listed below for reference.    Microbiology: Recent Results (from the past 240 hours)  Blood culture (routine x 2)     Status: None (Preliminary result)   Collection Time: 03/28/24  8:14 AM   Specimen: BLOOD LEFT ARM  Result Value Ref Range Status   Specimen Description BLOOD LEFT ARM  Final   Special Requests   Final    BOTTLES DRAWN AEROBIC ONLY Blood Culture results may not be optimal due to an inadequate volume of blood received in culture bottles   Culture   Final    NO GROWTH 3 DAYS Performed at [facility], [address] [place], [place] [address]    Report Status PENDING  Incomplete  Blood culture (routine x 2)     Status: None (Preliminary result)   Collection Time: 03/28/24  8:15 AM   Specimen: BLOOD LEFT ARM  Result Value Ref Range  Status   Specimen Description BLOOD LEFT ARM  Final   Special Requests   Final    BOTTLES DRAWN AEROBIC ONLY Blood Culture results  may not be optimal due to an inadequate volume of blood received in culture bottles   Culture   Final    NO GROWTH 3 DAYS Performed at [facility], [address], [place], [place] [address]    Report Status PENDING  Incomplete     Labs:   Basic Metabolic Panel: Recent Labs  Lab 03/27/24 1400 03/28/24 0521 03/29/24 0429 03/30/24 0440  NA 137 138 139 140  K 3.2* 3.4* 3.4* 3.7  CL 100 101 103 105  CO2 26 26 24 22   GLUCOSE 117* 109* 105* 89  BUN 12 7 6 11   CREATININE 0.45 0.66 0.62 0.62  CALCIUM 9.7 9.0 8.5* 8.6*  MG  --  1.6* 1.7 1.6*   Liver Function Tests: Recent Labs  Lab 03/27/24 1400 03/28/24 0521 03/29/24 0429 03/30/24 0440  AST 388* 485* 132* 51*  ALT 230* 681* 374* 259*  ALKPHOS 129* 132* 128* 120  BILITOT 1.2 1.1 0.6 0.5  PROT 7.9 6.6 6.1* 5.9*  ALBUMIN 4.3 3.4* 3.1* 2.9*   Recent Labs  Lab 03/27/24 1400  LIPASE 31    CBC: Recent Labs  Lab 03/27/24 1400 03/28/24 0521 03/29/24 0429 03/30/24 0440  WBC 14.3* 9.8 6.5 8.8  NEUTROABS  --  8.2*  --   --   HGB 10.4* 10.1* 9.3* 9.5*  HCT 33.4* 32.1* 29.1* 30.2*  MCV 81.1 80.0 81.5 80.5  PLT 467* 433* 355 374     IMAGING STUDIES DG ERCP Result Date: 03/29/2024 CLINICAL DATA:  Jaundiced status post cholecystectomy with abnormal MRCP suggesting common bile duct stones. EXAM: ERCP 10 fluoroscopic images of the right upper quadrant TECHNIQUE: Multiple spot images obtained with the fluoroscopic device and submitted for interpretation post-procedure. FLUOROSCOPY: Radiation Exposure Index (as provided by the fluoroscopic device): 64.42 mGy Kerma COMPARISON:  None Available. FINDINGS: Fluoroscopic images of the right upper quadrant demonstrate flexible endoscopy device with guidewire extending into the common bile duct as well as the pancreatic duct. Interval placement of a  pancreatic duct stent and common bile duct stent. Mild dilatation of the proximal pancreatic duct. Interval placement of a common duct plastic stent. No contrast extravasation. IMPRESSION: ERCP with placement of pancreatic duct stent and common bile duct stent. These images were submitted for radiologic interpretation only. Please see the procedural report for the amount of contrast and the fluoroscopy time utilized. Electronically Signed   By: Cordella Banner   On: 03/29/2024 18:51   DG C-Arm 1-60 Min-No Report Result Date: 03/29/2024 Fluoroscopy was utilized by the requesting physician.  No radiographic interpretation.   US  PELVIC COMPLETE WITH TRANSVAGINAL Result Date: 03/28/2024 CLINICAL DATA:  Uterine Mass. EXAM: TRANSABDOMINAL ULTRASOUND OF PELVIS TECHNIQUE: Transabdominal ultrasound examination of the pelvis was performed including evaluation of the uterus, ovaries, adnexal regions, and pelvic cul-de-sac. COMPARISON:  MR abdomen and CT abdomen pelvis 03/27/2024. FINDINGS: Uterus Measurements: 14.2 x 11.4 x 11.2 cm = volume: 943.3 mL. Large fundal heterogeneous mass measures 8.8 x 6.6 x 9.0 cm. Endometrium Thickness: 6 mm, within normal limits. No focal abnormality visualized. Right ovary Measurements: 4.7 x 1.6 x 2.8 cm = volume: 10.8 mL. Normal appearance/no adnexal mass. Left ovary Measurements: 3.9 x 1.5 x 3.2 cm = volume: 10.1 mL. Normal appearance/no adnexal mass. Other findings:  No abnormal free fluid. IMPRESSION: Large fundal fibroid. Electronically Signed   By: Newell Eke M.D.   On: 03/28/2024 15:43   MR ABDOMEN MRCP W WO CONTAST Result Date: 03/28/2024 EXAM: MRCP WITH  IV CONTRAST 03/27/2024 09:24:11 PM TECHNIQUE: Multisequence, multiplanar magnetic resonance images of the abdomen without and with intravenous contrast. MRCP sequences were performed. Exam detail diminished by motion artifact. COMPARISON: Comparison 03/27/2024 at 5:23 pm. CLINICAL HISTORY: 175970 Biliary obstruction  (HCC) 824029; suspected biliary obstruction. FINDINGS: LIVER: Hepatic steatosis. T2 hyperintense T1 isointense to hypointense structure within segment 3 without restricted diffusion is identified measuring 1.7 x 1.1 cm, image 20/8. This is indeterminate but does not show definitive enhancement on the postcontrast images. GALLBLADDER AND BILIARY SYSTEM: Gallbladder is surgically absent. There is an impacted stone within the common bile duct at the level of the head of pancreas as noted on the CT measuring 1.5 cm, image 39/20. Moderate intrahepatic bile duct dilatation with possible enhancement along the biliary radicles. SPLEEN: The spleen is within normal limits in size and appearance. PANCREAS/PANCREATIC DUCT: The pancreas is normal in size and contour, without focal lesion or ductal dilatation. ADRENAL GLANDS: Normal size and morphology bilaterally. No nodule, thickening, or hemorrhage. No periadrenal stranding. KIDNEYS: Bosniak class cysts within the medial cortex of the interpolar right kidney measure 0.8 cm, image 25/8. No follow-up imaging recommended. LYMPH NODES: No enlarged abdominal lymph nodes. VASULATURE: Unremarkable. PERITONEUM: Small volume of perihepatic fluid identified. No focal fluid collections. ABDOMINAL WALL: No hernia. No mass. BOWEL: Grossly unremarkable. No bowel obstruction. BONES: No acute abnormality or worrisome osseous lesion. SOFT TISSUES: Unremarkable. MISCELLANEOUS: Fibroid uterus is again noted. IMPRESSION: 1. Choledocholithiasis with an impacted 1.5 cm common bile duct stone at the pancreatic head and associated moderate intrahepatic bile duct dilatation, with possible biliary radicle enhancement. 2. Indeterminate 1.7 x 1.1 cm T2 hyperintense, T1 iso- to hypointense lesion in hepatic segment 3 without restricted diffusion or definitive postcontrast enhancement. Suggest follow-up imaging in 3 months with repeat MRI with contrast material to assess for stability. Electronically  signed by: Waddell Calk MD 03/28/2024 05:15 AM EST RP Workstation: [id number]   MR 3D Recon At Scanner Result Date: 03/28/2024 EXAM: MRCP WITH IV CONTRAST 03/27/2024 09:24:11 PM TECHNIQUE: Multisequence, multiplanar magnetic resonance images of the abdomen without and with intravenous contrast. MRCP sequences were performed. Exam detail diminished by motion artifact. COMPARISON: Comparison 03/27/2024 at 5:23 pm. CLINICAL HISTORY: 175970 Biliary obstruction (HCC) 824029; suspected biliary obstruction. FINDINGS: LIVER: Hepatic steatosis. T2 hyperintense T1 isointense to hypointense structure within segment 3 without restricted diffusion is identified measuring 1.7 x 1.1 cm, image 20/8. This is indeterminate but does not show definitive enhancement on the postcontrast images. GALLBLADDER AND BILIARY SYSTEM: Gallbladder is surgically absent. There is an impacted stone within the common bile duct at the level of the head of pancreas as noted on the CT measuring 1.5 cm, image 39/20. Moderate intrahepatic bile duct dilatation with possible enhancement along the biliary radicles. SPLEEN: The spleen is within normal limits in size and appearance. PANCREAS/PANCREATIC DUCT: The pancreas is normal in size and contour, without focal lesion or ductal dilatation. ADRENAL GLANDS: Normal size and morphology bilaterally. No nodule, thickening, or hemorrhage. No periadrenal stranding. KIDNEYS: Bosniak class cysts within the medial cortex of the interpolar right kidney measure 0.8 cm, image 25/8. No follow-up imaging recommended. LYMPH NODES: No enlarged abdominal lymph nodes. VASULATURE: Unremarkable. PERITONEUM: Small volume of perihepatic fluid identified. No focal fluid collections. ABDOMINAL WALL: No hernia. No mass. BOWEL: Grossly unremarkable. No bowel obstruction. BONES: No acute abnormality or worrisome osseous lesion. SOFT TISSUES: Unremarkable. MISCELLANEOUS: Fibroid uterus is again noted. IMPRESSION: 1.  Choledocholithiasis with an impacted 1.5 cm common bile duct stone at  the pancreatic head and associated moderate intrahepatic bile duct dilatation, with possible biliary radicle enhancement. 2. Indeterminate 1.7 x 1.1 cm T2 hyperintense, T1 iso- to hypointense lesion in hepatic segment 3 without restricted diffusion or definitive postcontrast enhancement. Suggest follow-up imaging in 3 months with repeat MRI with contrast material to assess for stability. Electronically signed by: Waddell Calk MD 03/28/2024 05:15 AM EST RP Workstation: [id number]   CT ABDOMEN PELVIS W CONTRAST Result Date: 03/27/2024 EXAM: CT ABDOMEN AND PELVIS WITH CONTRAST 03/27/2024 05:35:42 PM TECHNIQUE: CT of the abdomen and pelvis was performed with the administration of 100 mL of iohexol (OMNIPAQUE) 300 MG/ML solution. Multiplanar reformatted images are provided for review. Automated exposure control, iterative reconstruction, and/or weight-based adjustment of the mA/kV was utilized to reduce the radiation dose to as low as reasonably achievable. COMPARISON: None available. CLINICAL HISTORY: Abdominal pain, acute, nonlocalized. FINDINGS: LOWER CHEST: No acute abnormality. LIVER: Mild intrahepatic biliary ductal dilatation and pneumobilia. GALLBLADDER AND BILE DUCTS: Gallbladder is surgically absent. Choledocholithiasis is present. The common bile duct is dilated measuring up to 12 mm. SPLEEN: No acute abnormality. PANCREAS: No acute abnormality. ADRENAL GLANDS: No acute abnormality. KIDNEYS, URETERS AND BLADDER: There is a 9 mm right renal cyst. Per consensus, no follow-up is needed for simple Bosniak type 1 and 2 renal cysts, unless the patient has a malignancy history or risk factors. No stones in the kidneys or ureters. No hydronephrosis. No perinephric or periureteral stranding. Urinary bladder is unremarkable. GI AND BOWEL: Stomach demonstrates no acute abnormality. Appendix is normal. There is no bowel obstruction. PERITONEUM AND  RETROPERITONEUM: No ascites. No free air. VASCULATURE: Aorta is normal in caliber. LYMPH NODES: No lymphadenopathy. REPRODUCTIVE ORGANS: There is a small amount of fluid in the endometrial canal. There is a 9 cm heterogeneous mass in posterior uterus, likely a fibroid. There is some central areas of hyperdensity and internal hemorrhage cannot be excluded within this fibroid. Adnexa are within normal limits. BONES AND SOFT TISSUES: No acute osseous abnormality. No focal soft tissue abnormality. IMPRESSION: 1. Choledocholithiasis with dilated common bile duct (up to 12 mm), mild intrahepatic biliary ductal dilatation, and pneumobilia  recommend correlation with prior imaging and clinical evaluation for possible biliary obstruction; consider hepatobiliary ultrasound or MRCP and gastroenterology/hepatobiliary consultation as clinically indicated. 2. 9 cm heterogeneous mass in posterior uterus, likely a fibroid, with possible internal hemorrhage  recommend gynecologic correlation; consider pelvic ultrasound or MRI with contrast for further characterization and management. 3. 9 mm right renal cyst  simple-appearing; no follow-up recommended. 4. Small amount of fluid in the endometrial canal  correlate with menstrual history; gynecologic follow-up as indicated. Electronically signed by: Greig Pique MD 03/27/2024 06:12 PM EST RP Workstation: [id number]    DISCHARGE EXAMINATION: See progress note from earlier today  DISPOSITION: Home  Discharge Instructions     Call MD for:  difficulty breathing, headache or visual disturbances   Complete by: As directed    Call MD for:  extreme fatigue   Complete by: As directed    Call MD for:  hives   Complete by: As directed    Call MD for:  persistant dizziness or light-headedness   Complete by: As directed    Call MD for:  persistant nausea and vomiting   Complete by: As directed    Call MD for:  severe uncontrolled pain   Complete by: As directed    Call MD  for:  temperature >100.4   Complete by: As directed    Diet -  low sodium heart healthy   Complete by: As directed    Discharge instructions   Complete by: As directed    Please take your medications as prescribed. Please have your PCP do blood work in 1 week to check your liver function. GI will arrange outpatient follow up.  You were cared for by a hospitalist during your hospital stay. If you have any questions about your discharge medications or the care you received while you were in the hospital after you are discharged, you can call the unit and asked to speak with the hospitalist on call if the hospitalist that took care of you is not available. Once you are discharged, your primary care physician will handle any further medical issues. Please note that NO REFILLS for any discharge medications will be authorized once you are discharged, as it is imperative that you return to your primary care physician (or establish a relationship with a primary care physician if you do not have one) for your aftercare needs so that they can reassess your need for medications and monitor your lab values. If you do not have a primary care physician, you can call [phone] for a physician referral.   Increase activity slowly   Complete by: As directed          Allergies as of 03/30/2024   No Known Allergies      Medication List     TAKE these medications    FeroSul 325 (65 Fe) MG tablet Generic drug: ferrous sulfate Take 1 tablet (325 mg total) by mouth 2 (two) times daily.   levothyroxine 137 MCG tablet Commonly known as: SYNTHROID Take 137 mcg by mouth daily before breakfast.   pantoprazole 40 MG tablet Commonly known as: Protonix Tome 1 tableta (40 mg en total) por va oral diariamente. (Take 1 tablet (40 mg total) by mouth daily.)   polyethylene glycol powder 17 GM/SCOOP powder Commonly known as: GLYCOLAX/MIRALAX Take 17 g by mouth 2 (two) times daily. Dissolve 1 capful (17g) in 4-8  ounces of liquid and take by mouth daily.          Follow-up Information     [facility]. Call.   Specialty: Obstetrics and Gynecology Why: please call and make appointment to discuss fibroids in your uterus. Contact information: [address] [place]  [address] [phone]        Cecily Katz, [place]. Schedule an appointment as soon as possible for a visit in 1 week(s).   Specialty: Physician Assistant Why: post hospitalization follow up and for bloodwork to check liver function. Contact information: [address] [place] [address] [phone]                 TOTAL DISCHARGE TIME: 35 minutes  Joyceann Kruser Verdene  [facility] Pager on [email or web address]  03/31/2024, 10:37 AM

## 2024-03-30 NOTE — TOC CM/SW Note (Signed)
 Transition of Care [facility]) - Inpatient Brief Assessment   Patient Details  Name: Remell Giaimo MRN: [id number] Date of Birth: [birth year]  Transition of Care [facility]) CM/SW Contact:    Lauraine FORBES Saa, LCSWA Phone Number: 03/30/2024, 10:11 AM   Clinical Narrative:  10:11 AM Per chart review, patient resides at home with spouse. Patient has a PCP and not insurance. CSW consulted financial counseling for further assistance. Patient does not have SNF/HH/DME history. Patient's preferred pharmacy is [facility] [address] CSW provided patient SDOH (food, utilities) resources with medical interpreter to translate conversation [place] [phone]). No other TOC needs identified at this time. TOC will continue to follow.  Transition of Care Asessment: Insurance and Status: Selfpay Patient has primary care physician: Yes Home environment has been reviewed: Private Residence Prior level of function:: N/A Prior/Current Home Services: No current home services Social Drivers of Health Review: SDOH reviewed interventions complete Readmission risk has been reviewed: Yes Transition of care needs: no transition of care needs at this time

## 2024-03-30 NOTE — Progress Notes (Signed)
 Progress Note   Subjective  Patient states she is feeling okay - no pain, eating okay. S/p ERCP yesterday with biliary stent.   Objective   Vital signs in last 24 hours: Temp:  [98.2 F (36.8 C)-99.1 F (37.3 C)] 98.2 F (36.8 C) (11/10 0753) Pulse Rate:  [58-70] 70 (11/10 0753) Resp:  [12-18] 18 (11/10 0753) BP: (92-119)/(54-72) 112/66 (11/10 0753) SpO2:  [94 %-100 %] 100 % (11/10 0753) Weight:  [64 kg] 64 kg (11/10 0500) Last BM Date : 03/27/24 General:    female in NAD Neurologic:  Alert and oriented,  grossly normal neurologically. Psych:  Cooperative. Normal mood and affect.  Intake/Output from previous day: 11/09 0701 - 11/10 0700 In: 1650 [P.O.:600; I.V.:850; IV Piggyback:200] Out: 400 [Urine:400] Intake/Output this shift: No intake/output data recorded.  Lab Results: Recent Labs    03/28/24 0521 03/29/24 0429 03/30/24 0440  WBC 9.8 6.5 8.8  HGB 10.1* 9.3* 9.5*  HCT 32.1* 29.1* 30.2*  PLT 433* 355 374   BMET Recent Labs    03/28/24 0521 03/29/24 0429 03/30/24 0440  NA 138 139 140  K 3.4* 3.4* 3.7  CL 101 103 105  CO2 26 24 22   GLUCOSE 109* 105* 89  BUN 7 6 11   CREATININE 0.66 0.62 0.62  CALCIUM 9.0 8.5* 8.6*   LFT Recent Labs    03/30/24 0440  PROT 5.9*  ALBUMIN 2.9*  AST 51*  ALT 259*  ALKPHOS 120  BILITOT 0.5   PT/INR Recent Labs    03/27/24 1610 03/28/24 1318  LABPROT 13.8 15.4*  INR 1.0 1.2    Studies/Results: DG ERCP Result Date: 03/29/2024 CLINICAL DATA:  Jaundiced status post cholecystectomy with abnormal MRCP suggesting common bile duct stones. EXAM: ERCP 10 fluoroscopic images of the right upper quadrant TECHNIQUE: Multiple spot images obtained with the fluoroscopic device and submitted for interpretation post-procedure. FLUOROSCOPY: Radiation Exposure Index (as provided by the fluoroscopic device): 64.42 mGy Kerma COMPARISON:  None Available. FINDINGS: Fluoroscopic images of the right upper quadrant demonstrate  flexible endoscopy device with guidewire extending into the common bile duct as well as the pancreatic duct. Interval placement of a pancreatic duct stent and common bile duct stent. Mild dilatation of the proximal pancreatic duct. Interval placement of a common duct plastic stent. No contrast extravasation. IMPRESSION: ERCP with placement of pancreatic duct stent and common bile duct stent. These images were submitted for radiologic interpretation only. Please see the procedural report for the amount of contrast and the fluoroscopy time utilized. Electronically Signed   By: Cordella Banner   On: 03/29/2024 18:51   DG C-Arm 1-60 Min-No Report Result Date: 03/29/2024 Fluoroscopy was utilized by the requesting physician.  No radiographic interpretation.       Assessment / Plan:    43 y/o female here with the following:  Choledocholithiasis s/p ERCP with biliary stent placement Elevated liver enzymes  She is s/p ERCP yesterday with Dr. Avram - impacted 10mm stone in the bile duct. It was not able to be removed yesterday during the procedure but the bile duct stented above it for adequate drainage. Liver enzymes improved today.   She is feeling well without complaints. Spoke with her via translator about what was found and plans moving forward. Dr. Avram has been in touch with Dr. Jinny of [facility] about follow up ERCP that is needed for stone removal, and Dr. Jinny has graciously offered to do this as outpatient for her in upcoming weeks.  She will be contacted for scheduling in the near future.  Otherwise, okay to be discharged today. She should have follow up labs checked by her PCP next week to make sure stable.  Call with questions, we will sign off for now.  Marcey Naval, MD [facility]

## 2024-03-31 ENCOUNTER — Telehealth: Payer: Self-pay

## 2024-03-31 NOTE — Telephone Encounter (Signed)
-----   Message from Rogelia Copping sent at 03/31/2024  1:18 PM EST ----- Regarding: FW: Impacted CBD stone  ----- Message ----- From: Wilhelmenia Aloha Raddle., MD Sent: 03/29/2024   3:29 PM EST To: Lupita FORBES Commander, MD; Andrea CINDERELLA Bench, CM# Subject: RE: Impacted CBD stone                         Ok, So will let Darren handle this. Let me know if I can be of assistance in future. GM ----- Message ----- From: Copping Rogelia, MD Sent: 03/29/2024   2:09 PM EST To: Lupita FORBES Commander, MD; Andrea CINDERELLA Bench, CM# Subject: RE: Impacted CBD stone                          Thanks. Will have my office set it up. Should not be a problem. Will have my office contact her. ----- Message ----- From: Commander Lupita FORBES, MD Sent: 03/29/2024   2:06 PM EST To: Rogelia Copping, MD; Aloha Wilhelmenia Raddle., MD Subject: Impacted CBD stone                             Gabe and Darren,  This lady has an impacted CBD stone. She came from [facility]  I have a 7 French stent in to drain her.  She will need an outpatient ERCP with attempted stone removal, with plans to possibly do contact lithotripsy.  Please advise re: availability and ability to accomplish.  Thanks,  Lupita ----- Message ----- From: Commander Lupita FORBES, MD Sent: 03/29/2024   1:47 PM EST To: Odetta LITTIE Curly, RN; Aloha Wilhelmenia Raddle., # Subject: Impacted CBD stone                             Gabe,  This lady has an impacted CBD stone.  I have a 7 French stent in to drain her.  She will need an outpatient ERCP with attempted stone removal, with plans to possibly do contact lithotripsy.  Please advise as to your opinion and availability on this.  Thanks,  Lupita

## 2024-04-02 LAB — CULTURE, BLOOD (ROUTINE X 2)
Culture: NO GROWTH
Culture: NO GROWTH

## 2024-04-08 ENCOUNTER — Other Ambulatory Visit: Payer: Self-pay

## 2024-04-08 DIAGNOSIS — K8050 Calculus of bile duct without cholangitis or cholecystitis without obstruction: Secondary | ICD-10-CM

## 2024-04-27 ENCOUNTER — Encounter: Admission: RE | Disposition: A | Payer: Self-pay | Source: Home / Self Care | Attending: Gastroenterology

## 2024-04-27 ENCOUNTER — Encounter: Payer: Self-pay | Admitting: Gastroenterology

## 2024-04-27 ENCOUNTER — Ambulatory Visit
Admission: RE | Admit: 2024-04-27 | Discharge: 2024-04-27 | Disposition: A | Payer: Self-pay | Attending: Gastroenterology | Admitting: Gastroenterology

## 2024-04-27 DIAGNOSIS — K8050 Calculus of bile duct without cholangitis or cholecystitis without obstruction: Secondary | ICD-10-CM

## 2024-04-27 HISTORY — PX: ERCP: SHX5425

## 2024-04-27 LAB — POCT PREGNANCY, URINE: Preg Test, Ur: NEGATIVE

## 2024-04-27 SURGERY — ERCP, WITH INTERVENTION IF INDICATED
Anesthesia: General

## 2024-04-27 MED ORDER — LIDOCAINE HCL (PF) 2 % IJ SOLN
INTRAMUSCULAR | Status: AC
  Filled 2024-04-27: qty 5

## 2024-04-27 MED ORDER — SODIUM CHLORIDE 0.9 % IV SOLN: INTRAVENOUS | Status: DC

## 2024-04-27 NOTE — Anesthesia Preprocedure Evaluation (Signed)
 Anesthesia Evaluation  Patient identified by MRN, date of birth, ID band Patient awake    Reviewed: Allergy & Precautions, NPO status , Patient's Chart, lab work & pertinent test results  History of Anesthesia Complications Negative for: history of anesthetic complications  Airway Mallampati: III  TM Distance: >3 FB Neck ROM: full    Dental no notable dental hx.    Pulmonary neg pulmonary ROS   Pulmonary exam normal        Cardiovascular negative cardio ROS Normal cardiovascular exam     Neuro/Psych negative neurological ROS  negative psych ROS   GI/Hepatic Neg liver ROS,GERD  Medicated,,  Endo/Other  Hypothyroidism    Renal/GU negative Renal ROS  negative genitourinary   Musculoskeletal   Abdominal   Peds  Hematology negative hematology ROS (+)   Anesthesia Other Findings Past Medical History: No date: Choledocholithiasis No date: Cholelithiasis No date: Hypothyroid  Past Surgical History: No date: CHOLECYSTECTOMY 03/29/2024: ERCP; N/A     Comment:  Procedure: ERCP, WITH INTERVENTION IF INDICATED;                Surgeon: Avram Lupita BRAVO, MD;  Location[facility] END[facility]COPY;                Service: Gastroenterology;  Laterality: N/A;     Reproductive/Obstetrics negative OB ROS                              Anesthesia Physical Anesthesia Plan  ASA: 2  Anesthesia Plan: General   Post-op Pain Management: Minimal or no pain anticipated   Induction: Intravenous  PONV Risk Score and Plan: 2 and Propofol  infusion and TIVA  Airway Management Planned: Natural Airway and Nasal Cannula  Additional Equipment:   Intra-op Plan:   Post-operative Plan:   Informed Consent: I have reviewed the patients History and Physical, chart, labs and discussed the procedure including the risks, benefits and alternatives for the proposed anesthesia with the patient or authorized representative who has  indicated his/her understanding and acceptance.     Dental Advisory Given  Plan Discussed with: Anesthesiologist, CRNA and Surgeon  Anesthesia Plan Comments: (Patient consented for risks of anesthesia including but not limited to:  - adverse reactions to medications - risk of airway placement if required - damage to eyes, teeth, lips or other oral mucosa - nerve damage due to positioning  - sore throat or hoarseness - Damage to heart, brain, nerves, lungs, other parts of body or loss of life  Patient voiced understanding and assent.)        Anesthesia Quick Evaluation

## 2024-04-27 NOTE — Telephone Encounter (Signed)
 Came in sick with fevers and cough. Sent home. reschedule

## 2024-04-27 NOTE — OR Nursing (Signed)
 Patient came in with chills, sore throat and a temp. of 100.6, Dr. Chesley with was notified and patient was canceled.

## 2024-04-28 ENCOUNTER — Telehealth: Payer: Self-pay

## 2024-04-28 NOTE — Telephone Encounter (Signed)
 Received secure chat from [place], [place] in Endo stating the following on 04/27/24 , Pt arrived for her procedure with fever, chills & sore throat- cancelled and will need to be rescheduled.   She was scheduled for ERCP.  Message routed to [facility], CMA to make her aware that patient's ERCP for yesterday had to be canceled due to her being sick.  Thanks,  Rosaline CMA

## 2024-04-29 ENCOUNTER — Telehealth: Payer: Self-pay

## 2024-04-29 ENCOUNTER — Other Ambulatory Visit: Payer: Self-pay

## 2024-04-29 DIAGNOSIS — Z1211 Encounter for screening for malignant neoplasm of colon: Secondary | ICD-10-CM

## 2024-04-29 DIAGNOSIS — K8050 Calculus of bile duct without cholangitis or cholecystitis without obstruction: Secondary | ICD-10-CM

## 2024-04-29 MED ORDER — NA SULFATE-K SULFATE-MG SULF 17.5-3.13-1.6 GM/177ML PO SOLN: 354.0000 mL | Freq: Once | ORAL | 0 refills | Status: AC

## 2024-04-29 NOTE — Telephone Encounter (Signed)
 error

## 2024-04-29 NOTE — Telephone Encounter (Signed)
 Patient called wanting to reschedule her ERCP that was cancelled on 04/27/2024 due to being febrile. Patient is now reschedule to have her ERCP on 06/15/2024 with Dr. Jinny. Andrea was informed as well and she placed the order for the patent's ERCP. Instructions were provided to the patient and she requested to have it emailed to her at: [email or web address] Patient understood everything and had no further questions.

## 2024-04-29 NOTE — Addendum Note (Signed)
 Addended by: LANNIE ANDREA GRADE on: 04/29/2024 03:36 PM   Modules accepted: Orders

## 2024-05-01 ENCOUNTER — Ambulatory Visit: Admit: 2024-05-01 | Payer: Self-pay

## 2024-05-01 SURGERY — COLONOSCOPY
Anesthesia: General

## 2024-05-04 NOTE — Telephone Encounter (Signed)
 Mackenzie Douglas, CMA    04/29/24  3:35 PM Note Patient called wanting to reschedule her ERCP that was cancelled on 04/27/2024 due to being febrile. Patient is now reschedule to have her ERCP on 06/15/2024 with Dr. Jinny. Mackenzie Douglas was informed as well and she placed the order for the patent's ERCP. Instructions were provided to the patient and she requested to have it emailed to her at: [email or web address] Patient understood everything and had no further questions.

## 2024-06-12 ENCOUNTER — Telehealth: Payer: Self-pay

## 2024-06-12 NOTE — Telephone Encounter (Signed)
 Using interpreter service called pt to r/s ERCP due to inclement weather... Pt has been moved to 06/22/24 and endo has been notified... New PPW emailed to pt per her request [email or web address]

## 2024-06-29 ENCOUNTER — Ambulatory Visit: Admission: RE | Admit: 2024-06-29 | Payer: Self-pay | Source: Home / Self Care | Admitting: Gastroenterology

## 2024-06-29 ENCOUNTER — Encounter: Admission: RE | Payer: Self-pay | Source: Home / Self Care
